# Patient Record
Sex: Male | Born: 1970 | Race: White | Hispanic: No | Marital: Married | Smoking: Never smoker
Health system: Southern US, Community
[De-identification: ages and names within clinical notes are randomized; demographics above are authoritative.]

## PROBLEM LIST (undated history)

## (undated) DIAGNOSIS — N2889 Other specified disorders of kidney and ureter: Secondary | ICD-10-CM

## (undated) DIAGNOSIS — Z87438 Personal history of other diseases of male genital organs: Secondary | ICD-10-CM

## (undated) DIAGNOSIS — K219 Gastro-esophageal reflux disease without esophagitis: Secondary | ICD-10-CM

## (undated) DIAGNOSIS — J302 Other seasonal allergic rhinitis: Secondary | ICD-10-CM

## (undated) DIAGNOSIS — I10 Essential (primary) hypertension: Secondary | ICD-10-CM

## (undated) DIAGNOSIS — E119 Type 2 diabetes mellitus without complications: Secondary | ICD-10-CM

## (undated) DIAGNOSIS — E785 Hyperlipidemia, unspecified: Secondary | ICD-10-CM

## (undated) DIAGNOSIS — Z87442 Personal history of urinary calculi: Secondary | ICD-10-CM

## (undated) HISTORY — PX: WISDOM TOOTH EXTRACTION: SHX21

## (undated) HISTORY — PX: TESTICLE SURGERY: SHX794

---
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
## 2018-06-19 DIAGNOSIS — N2889 Other specified disorders of kidney and ureter: Secondary | ICD-10-CM

## 2018-06-19 HISTORY — DX: Other specified disorders of kidney and ureter: N28.89

## 2018-07-09 ENCOUNTER — Ambulatory Visit (HOSPITAL_COMMUNITY)
Admission: RE | Admit: 2018-07-09 | Discharge: 2018-07-09 | Disposition: A | Discharge status: Home / Self Care | Payer: Commercial Managed Care - PPO | Source: Ambulatory Visit | Attending: Urology | Admitting: Urology

## 2018-07-09 ENCOUNTER — Other Ambulatory Visit (HOSPITAL_COMMUNITY): Payer: Self-pay | Admitting: Urology

## 2018-07-09 ENCOUNTER — Other Ambulatory Visit: Payer: Self-pay | Admitting: Urology

## 2018-07-09 ENCOUNTER — Other Ambulatory Visit: Payer: Self-pay

## 2018-07-09 DIAGNOSIS — D4102 Neoplasm of uncertain behavior of left kidney: Secondary | ICD-10-CM | POA: Diagnosis present

## 2018-07-09 MED ORDER — GADOBUTROL 1 MMOL/ML IV SOLN
10.0000 mL | Freq: Once | INTRAVENOUS | Status: AC | PRN
  Administered 2018-07-09: 10 mL via INTRAVENOUS

## 2018-07-16 ENCOUNTER — Encounter (HOSPITAL_COMMUNITY): Payer: Self-pay

## 2018-07-16 NOTE — Patient Instructions (Addendum)
DUE TO COVID-19 NO VISITORS ARE ALLOWED IN THE HOSPITAL AT THIS TIME   COVID SWAB TESTING MUST BE COMPLETED ON:  Monday, July 20, 2018 10:00AM (Must self quarantine after testing. Follow instructions on handout.)   Your procedure is scheduled on: Thursday, July 23, 2018   Surgery Time:  12Noon-4:26PM   Report to [facility] Main  Entrance    Report to admitting at 10:00 AM   Call this number if you have problems the morning of surgery [phone]   Do not eat food or drink liquids :After Midnight.   Brush your teeth the morning of surgery.   Do NOT smoke after Midnight   Take these medicines the morning of surgery with A SIP OF WATER: Atorvastatin  DO NOT TAKE ANY DIABETIC MEDICATIONS DAY OF YOUR SURGERY                               You may not have any metal on your body including jewelry, and body piercings             Do not wear lotions, powders, perfumes/cologne, or deodorant                           Men may shave face and neck.   Do not bring valuables to the hospital. [place].   Contacts, dentures or bridgework may not be worn into surgery.   Bring small overnight bag day of surgery.    Special Instructions: Bring a copy of your healthcare power of attorney and living will documents         the day of surgery if you haven't scanned them in before.              Please read over the following fact sheets you were given:  [facility] - Preparing for Surgery Before surgery, you can play an important role.  Because skin is not sterile, your skin needs to be as free of germs as possible.  You can reduce the number of germs on your skin by washing with CHG (chlorahexidine gluconate) soap before surgery.  CHG is an antiseptic cleaner which kills germs and bonds with the skin to continue killing germs even after washing. Please DO NOT use if you have an allergy to CHG or antibacterial soaps.  If your skin becomes  reddened/irritated stop using the CHG and inform your nurse when you arrive at Short Stay. Do not shave (including legs and underarms) for at least 48 hours prior to the first CHG shower.  You may shave your face/neck.  Please follow these instructions carefully:  1.  Shower with CHG Soap the night before surgery and the  morning of surgery.  2.  If you choose to wash your hair, wash your hair first as usual with your normal  shampoo.  3.  After you shampoo, rinse your hair and body thoroughly to remove the shampoo.                             4.  Use CHG as you would any other liquid soap.  You can apply chg directly to the skin and wash.  Gently with a scrungie or clean washcloth.  5.  Apply the CHG Soap to your body ONLY FROM THE NECK DOWN.   Do   not use on face/ open                           Wound or open sores. Avoid contact with eyes, ears mouth and   genitals (private parts).                       Wash face,  Genitals (private parts) with your normal soap.             6.  Wash thoroughly, paying special attention to the area where your    surgery  will be performed.  7.  Thoroughly rinse your body with warm water from the neck down.  8.  DO NOT shower/wash with your normal soap after using and rinsing off the CHG Soap.                9.  Pat yourself dry with a clean towel.            10.  Wear clean pajamas.            11.  Place clean sheets on your bed the night of your first shower and do not  sleep with pets. Day of Surgery : Do not apply any lotions/deodorants the morning of surgery.  Please wear clean clothes to the hospital/surgery center.  FAILURE TO FOLLOW THESE INSTRUCTIONS MAY RESULT IN THE CANCELLATION OF YOUR SURGERY  PATIENT SIGNATURE_________________________________  NURSE SIGNATURE__________________________________  ________________________________________________________________________

## 2018-07-16 NOTE — Progress Notes (Signed)
SPOKE W/  Legrand Como     SCREENING SYMPTOMS OF COVID 19:   COUGH--NO  RUNNY NOSE--- NO  SORE THROAT---NO  NASAL CONGESTION----NO  SNEEZING----NO  SHORTNESS OF BREATH---NO  DIFFICULTY BREATHING---NO  TEMP >100.0 -----NO  UNEXPLAINED BODY ACHES------NO  CHILLS -------- NO  HEADACHES ---------NO  LOSS OF SMELL/ TASTE --------NO    HAVE YOU OR ANY FAMILY MEMBER TRAVELLED PAST 14 DAYS OUT OF THE   COUNTY---LIVES IN [place] STATE----NO COUNTRY----NO  HAVE YOU OR ANY FAMILY MEMBER BEEN EXPOSED TO ANYONE WITH COVID 19? NO

## 2018-07-17 ENCOUNTER — Other Ambulatory Visit: Payer: Self-pay

## 2018-07-17 ENCOUNTER — Encounter (HOSPITAL_COMMUNITY)
Admission: RE | Admit: 2018-07-17 | Discharge: 2018-07-17 | Disposition: A | Discharge status: Home / Self Care | Payer: Commercial Managed Care - PPO | Source: Ambulatory Visit | Attending: Urology | Admitting: Urology

## 2018-07-17 ENCOUNTER — Encounter (HOSPITAL_COMMUNITY): Payer: Self-pay

## 2018-07-17 DIAGNOSIS — Z01818 Encounter for other preprocedural examination: Secondary | ICD-10-CM | POA: Insufficient documentation

## 2018-07-17 DIAGNOSIS — I498 Other specified cardiac arrhythmias: Secondary | ICD-10-CM | POA: Diagnosis not present

## 2018-07-17 DIAGNOSIS — Z1159 Encounter for screening for other viral diseases: Secondary | ICD-10-CM | POA: Insufficient documentation

## 2018-07-17 DIAGNOSIS — N2889 Other specified disorders of kidney and ureter: Secondary | ICD-10-CM | POA: Diagnosis not present

## 2018-07-17 HISTORY — DX: Type 2 diabetes mellitus without complications: E11.9

## 2018-07-17 HISTORY — DX: Personal history of other diseases of male genital organs: Z87.438

## 2018-07-17 HISTORY — DX: Hyperlipidemia, unspecified: E78.5

## 2018-07-17 HISTORY — DX: Gastro-esophageal reflux disease without esophagitis: K21.9

## 2018-07-17 HISTORY — DX: Personal history of urinary calculi: Z87.442

## 2018-07-17 HISTORY — DX: Other seasonal allergic rhinitis: J30.2

## 2018-07-17 HISTORY — DX: Essential (primary) hypertension: I10

## 2018-07-17 LAB — ABO/RH: ABO/RH(D): O POS

## 2018-07-17 LAB — COMPREHENSIVE METABOLIC PANEL
ALT: 16 U/L (ref 0–44)
AST: 16 U/L (ref 15–41)
Albumin: 4.7 g/dL (ref 3.5–5.0)
Alkaline Phosphatase: 80 U/L (ref 38–126)
Anion gap: 9 (ref 5–15)
BUN: 21 mg/dL — ABNORMAL HIGH (ref 6–20)
CO2: 28 mmol/L (ref 22–32)
Calcium: 9.8 mg/dL (ref 8.9–10.3)
Chloride: 102 mmol/L (ref 98–111)
Creatinine, Ser: 1.34 mg/dL — ABNORMAL HIGH (ref 0.61–1.24)
GFR calc Af Amer: >60 mL/min (ref >60)
GFR calc non Af Amer: >60 mL/min (ref >60)
Glucose, Bld: 145 mg/dL — ABNORMAL HIGH (ref 70–99)
Potassium: 4.3 mmol/L (ref 3.5–5.1)
Sodium: 139 mmol/L (ref 135–145)
Total Bilirubin: 0.8 mg/dL (ref 0.3–1.2)
Total Protein: 7.8 g/dL (ref 6.5–8.1)

## 2018-07-17 LAB — CBC
HCT: 39.2 % (ref 39.0–52.0)
Hemoglobin: 12.8 g/dL — ABNORMAL LOW (ref 13.0–17.0)
MCH: 29.6 pg (ref 26.0–34.0)
MCHC: 32.7 g/dL (ref 30.0–36.0)
MCV: 90.7 fL (ref 80.0–100.0)
Platelets: 219 10*3/uL (ref 150–400)
RBC: 4.32 MIL/uL (ref 4.22–5.81)
RDW: 12.5 % (ref 11.5–15.5)
WBC: 5.3 10*3/uL (ref 4.0–10.5)
nRBC: 0 % (ref 0.0–0.2)

## 2018-07-17 LAB — HEMOGLOBIN A1C
Hgb A1c MFr Bld: 6.6 % — ABNORMAL HIGH (ref 4.8–5.6)
Mean Plasma Glucose: 142.72 mg/dL

## 2018-07-17 LAB — GLUCOSE, CAPILLARY: Glucose-Capillary: 88 mg/dL (ref 70–99)

## 2018-07-18 LAB — URINE CULTURE: Culture: NO GROWTH

## 2018-07-20 ENCOUNTER — Other Ambulatory Visit: Payer: Self-pay

## 2018-07-20 ENCOUNTER — Other Ambulatory Visit (HOSPITAL_COMMUNITY)
Admission: RE | Admit: 2018-07-20 | Discharge: 2018-07-20 | Disposition: A | Discharge status: Home / Self Care | Payer: Commercial Managed Care - PPO | Source: Ambulatory Visit | Attending: Urology | Admitting: Urology

## 2018-07-20 DIAGNOSIS — Z01818 Encounter for other preprocedural examination: Secondary | ICD-10-CM | POA: Diagnosis not present

## 2018-07-22 NOTE — Progress Notes (Addendum)
Message left for patient to call back with any symptoms of covid 19 or any new travel history.    SPOKE W/  Patient via phone     SCREENING SYMPTOMS OF COVID 19:   COUGH--no  RUNNY NOSE--- no  SORE THROAT---no  NASAL CONGESTION----no  SNEEZING----no  SHORTNESS OF BREATH---no  DIFFICULTY BREATHING---no  TEMP >100.0 -----no  UNEXPLAINED BODY ACHES------no  CHILLS -------- no  HEADACHES ---------no  LOSS OF SMELL/ TASTE --------no    HAVE YOU OR ANY FAMILY MEMBER TRAVELLED PAST 14 DAYS OUT OF THE   COUNTY---no STATE----no COUNTRY----no  HAVE YOU OR ANY FAMILY MEMBER BEEN EXPOSED TO ANYONE WITH COVID 19? no

## 2018-07-23 ENCOUNTER — Inpatient Hospital Stay (HOSPITAL_COMMUNITY)
Admission: RE | Admit: 2018-07-23 | Discharge: 2018-07-24 | DRG: 658 | Disposition: A | Discharge status: Home / Self Care | Payer: Commercial Managed Care - PPO | Attending: Urology | Admitting: Urology

## 2018-07-23 ENCOUNTER — Encounter (HOSPITAL_COMMUNITY): Payer: Self-pay | Admitting: *Deleted

## 2018-07-23 ENCOUNTER — Inpatient Hospital Stay (HOSPITAL_COMMUNITY): Payer: Commercial Managed Care - PPO | Admitting: Physician Assistant

## 2018-07-23 ENCOUNTER — Encounter (HOSPITAL_COMMUNITY)
Admission: RE | Disposition: A | Discharge status: Home / Self Care | Payer: Self-pay | Source: Home / Self Care | Attending: Urology

## 2018-07-23 ENCOUNTER — Other Ambulatory Visit: Payer: Self-pay

## 2018-07-23 ENCOUNTER — Inpatient Hospital Stay (HOSPITAL_COMMUNITY): Payer: Commercial Managed Care - PPO | Admitting: Certified Registered"

## 2018-07-23 DIAGNOSIS — N2889 Other specified disorders of kidney and ureter: Secondary | ICD-10-CM | POA: Diagnosis present

## 2018-07-23 DIAGNOSIS — K219 Gastro-esophageal reflux disease without esophagitis: Secondary | ICD-10-CM | POA: Diagnosis present

## 2018-07-23 DIAGNOSIS — I10 Essential (primary) hypertension: Secondary | ICD-10-CM | POA: Diagnosis present

## 2018-07-23 DIAGNOSIS — Z7984 Long term (current) use of oral hypoglycemic drugs: Secondary | ICD-10-CM

## 2018-07-23 DIAGNOSIS — E119 Type 2 diabetes mellitus without complications: Secondary | ICD-10-CM | POA: Diagnosis present

## 2018-07-23 DIAGNOSIS — C642 Malignant neoplasm of left kidney, except renal pelvis: Secondary | ICD-10-CM | POA: Diagnosis present

## 2018-07-23 DIAGNOSIS — E7800 Pure hypercholesterolemia, unspecified: Secondary | ICD-10-CM | POA: Diagnosis present

## 2018-07-23 HISTORY — PX: ROBOT ASSITED LAPAROSCOPIC NEPHROURETERECTOMY: SHX6077

## 2018-07-23 LAB — TYPE AND SCREEN
ABO/RH(D): O POS
Antibody Screen: NEGATIVE

## 2018-07-23 LAB — GLUCOSE, CAPILLARY
Glucose-Capillary: 105 mg/dL — ABNORMAL HIGH (ref 70–99)
Glucose-Capillary: 166 mg/dL — ABNORMAL HIGH (ref 70–99)
Glucose-Capillary: 147 mg/dL — ABNORMAL HIGH (ref 70–99)

## 2018-07-23 LAB — HEMOGLOBIN AND HEMATOCRIT, BLOOD
HCT: 36.8 % — ABNORMAL LOW (ref 39.0–52.0)
Hemoglobin: 12.5 g/dL — ABNORMAL LOW (ref 13.0–17.0)

## 2018-07-23 LAB — NOVEL CORONAVIRUS, NAA (HOSP ORDER, SEND-OUT TO REF LAB; TAT 18-24 HRS): SARS-CoV-2, NAA: NOT DETECTED

## 2018-07-23 SURGERY — NEPHROURETERECTOMY, ROBOT-ASSISTED, LAPAROSCOPIC
Anesthesia: General | Laterality: Left

## 2018-07-23 MED ORDER — SULFAMETHOXAZOLE-TRIMETHOPRIM 800-160 MG PO TABS: 1.0000 | ORAL_TABLET | Freq: Two times a day (BID) | ORAL | 0 refills | Status: DC

## 2018-07-23 MED ORDER — PHENYLEPHRINE 40 MCG/ML (10ML) SYRINGE FOR IV PUSH (FOR BLOOD PRESSURE SUPPORT)
PREFILLED_SYRINGE | INTRAVENOUS | Status: DC | PRN
  Administered 2018-07-23 (×2): 80 ug via INTRAVENOUS

## 2018-07-23 MED ORDER — BUPIVACAINE-EPINEPHRINE 0.5% -1:200000 IJ SOLN
INTRAMUSCULAR | Status: DC | PRN
  Administered 2018-07-23: 30 mL

## 2018-07-23 MED ORDER — ONDANSETRON HCL 4 MG/2ML IJ SOLN
INTRAMUSCULAR | Status: AC
  Filled 2018-07-23: qty 2

## 2018-07-23 MED ORDER — LACTATED RINGERS IV SOLN
INTRAVENOUS | Status: DC
  Administered 2018-07-23: 11:00:00 via INTRAVENOUS

## 2018-07-23 MED ORDER — PHENYLEPHRINE 40 MCG/ML (10ML) SYRINGE FOR IV PUSH (FOR BLOOD PRESSURE SUPPORT)
PREFILLED_SYRINGE | INTRAVENOUS | Status: AC
  Filled 2018-07-23: qty 10

## 2018-07-23 MED ORDER — TRAMADOL HCL 50 MG PO TABS: 50.0000 mg | ORAL_TABLET | Freq: Four times a day (QID) | ORAL | 0 refills | Status: DC | PRN

## 2018-07-23 MED ORDER — OXYCODONE HCL 5 MG/5ML PO SOLN: 5.0000 mg | Freq: Once | ORAL | Status: DC | PRN

## 2018-07-23 MED ORDER — ONDANSETRON HCL 4 MG/2ML IJ SOLN
INTRAMUSCULAR | Status: DC | PRN
  Administered 2018-07-23: 4 mg via INTRAVENOUS

## 2018-07-23 MED ORDER — HYDROMORPHONE HCL 1 MG/ML IJ SOLN: 0.2500 mg | INTRAMUSCULAR | Status: DC | PRN

## 2018-07-23 MED ORDER — FENTANYL CITRATE (PF) 250 MCG/5ML IJ SOLN
INTRAMUSCULAR | Status: AC
  Filled 2018-07-23: qty 5

## 2018-07-23 MED ORDER — ACETAMINOPHEN 10 MG/ML IV SOLN
1000.0000 mg | Freq: Four times a day (QID) | INTRAVENOUS | Status: AC
  Administered 2018-07-23 – 2018-07-24 (×4): 1000 mg via INTRAVENOUS
  Filled 2018-07-23 (×4): qty 100

## 2018-07-23 MED ORDER — LACTATED RINGERS IR SOLN
Status: DC | PRN
  Administered 2018-07-23: 1000 mL

## 2018-07-23 MED ORDER — ACETAMINOPHEN 500 MG PO TABS
1000.0000 mg | ORAL_TABLET | Freq: Once | ORAL | Status: AC
  Administered 2018-07-23: 1000 mg via ORAL
  Filled 2018-07-23: qty 2

## 2018-07-23 MED ORDER — PROMETHAZINE HCL 25 MG/ML IJ SOLN: 6.2500 mg | INTRAMUSCULAR | Status: DC | PRN

## 2018-07-23 MED ORDER — MIDAZOLAM HCL 2 MG/2ML IJ SOLN
INTRAMUSCULAR | Status: AC
  Filled 2018-07-23: qty 2

## 2018-07-23 MED ORDER — ROCURONIUM BROMIDE 10 MG/ML (PF) SYRINGE
PREFILLED_SYRINGE | INTRAVENOUS | Status: AC
  Filled 2018-07-23: qty 10

## 2018-07-23 MED ORDER — STERILE WATER FOR IRRIGATION IR SOLN
Status: DC | PRN
  Administered 2018-07-23: 1000 mL

## 2018-07-23 MED ORDER — DIPHENHYDRAMINE HCL 12.5 MG/5ML PO ELIX: 12.5000 mg | ORAL_SOLUTION | Freq: Four times a day (QID) | ORAL | Status: DC | PRN

## 2018-07-23 MED ORDER — PROPOFOL 10 MG/ML IV BOLUS
INTRAVENOUS | Status: DC | PRN
  Administered 2018-07-23: 200 mg via INTRAVENOUS
  Administered 2018-07-23: 80 mg via INTRAVENOUS

## 2018-07-23 MED ORDER — SODIUM CHLORIDE 0.45 % IV SOLN
INTRAVENOUS | Status: DC
  Administered 2018-07-23 – 2018-07-24 (×2): via INTRAVENOUS

## 2018-07-23 MED ORDER — TRAMADOL HCL 50 MG PO TABS
50.0000 mg | ORAL_TABLET | Freq: Four times a day (QID) | ORAL | Status: DC | PRN
  Administered 2018-07-24: 50 mg via ORAL
  Filled 2018-07-23: qty 1

## 2018-07-23 MED ORDER — DEXAMETHASONE SODIUM PHOSPHATE 10 MG/ML IJ SOLN
INTRAMUSCULAR | Status: AC
  Filled 2018-07-23: qty 1

## 2018-07-23 MED ORDER — LACTATED RINGERS IV SOLN
INTRAVENOUS | Status: DC
  Administered 2018-07-23 (×3): via INTRAVENOUS

## 2018-07-23 MED ORDER — SODIUM CHLORIDE (PF) 0.9 % IJ SOLN
INTRAMUSCULAR | Status: AC
  Filled 2018-07-23: qty 50

## 2018-07-23 MED ORDER — EPHEDRINE 5 MG/ML INJ
INTRAVENOUS | Status: AC
  Filled 2018-07-23: qty 10

## 2018-07-23 MED ORDER — LIDOCAINE 2% (20 MG/ML) 5 ML SYRINGE
INTRAMUSCULAR | Status: DC | PRN
  Administered 2018-07-23: 60 mg via INTRAVENOUS

## 2018-07-23 MED ORDER — CEFAZOLIN SODIUM-DEXTROSE 2-3 GM-%(50ML) IV SOLR
INTRAVENOUS | Status: DC | PRN
  Administered 2018-07-23: 2 g via INTRAVENOUS

## 2018-07-23 MED ORDER — FENTANYL CITRATE (PF) 100 MCG/2ML IJ SOLN
INTRAMUSCULAR | Status: DC | PRN
  Administered 2018-07-23 (×7): 50 ug via INTRAVENOUS

## 2018-07-23 MED ORDER — FENTANYL CITRATE (PF) 100 MCG/2ML IJ SOLN
INTRAMUSCULAR | Status: AC
  Filled 2018-07-23: qty 2

## 2018-07-23 MED ORDER — HYDROMORPHONE HCL 1 MG/ML IJ SOLN
0.5000 mg | INTRAMUSCULAR | Status: DC | PRN
  Administered 2018-07-24: 1 mg via INTRAVENOUS
  Filled 2018-07-23: qty 1

## 2018-07-23 MED ORDER — ROCURONIUM BROMIDE 10 MG/ML (PF) SYRINGE
PREFILLED_SYRINGE | INTRAVENOUS | Status: DC | PRN
  Administered 2018-07-23 (×2): 20 mg via INTRAVENOUS
  Administered 2018-07-23: 60 mg via INTRAVENOUS

## 2018-07-23 MED ORDER — DOCUSATE SODIUM 100 MG PO CAPS
100.0000 mg | ORAL_CAPSULE | Freq: Two times a day (BID) | ORAL | Status: DC
  Administered 2018-07-23 – 2018-07-24 (×2): 100 mg via ORAL
  Filled 2018-07-23 (×2): qty 1

## 2018-07-23 MED ORDER — ATORVASTATIN CALCIUM 20 MG PO TABS
20.0000 mg | ORAL_TABLET | Freq: Every day | ORAL | Status: DC
  Administered 2018-07-24: 20 mg via ORAL
  Filled 2018-07-23: qty 1

## 2018-07-23 MED ORDER — SUGAMMADEX SODIUM 200 MG/2ML IV SOLN
INTRAVENOUS | Status: AC
  Filled 2018-07-23: qty 2

## 2018-07-23 MED ORDER — CEFAZOLIN SODIUM-DEXTROSE 2-4 GM/100ML-% IV SOLN
2.0000 g | INTRAVENOUS | Status: AC
  Administered 2018-07-23: 2 g via INTRAVENOUS
  Filled 2018-07-23: qty 100

## 2018-07-23 MED ORDER — KETOROLAC TROMETHAMINE 15 MG/ML IJ SOLN
15.0000 mg | Freq: Four times a day (QID) | INTRAMUSCULAR | Status: DC
  Administered 2018-07-23 – 2018-07-24 (×4): 15 mg via INTRAVENOUS
  Filled 2018-07-23 (×4): qty 1

## 2018-07-23 MED ORDER — ONDANSETRON HCL 4 MG/2ML IJ SOLN: 4.0000 mg | INTRAMUSCULAR | Status: DC | PRN

## 2018-07-23 MED ORDER — BACITRACIN-NEOMYCIN-POLYMYXIN 400-5-5000 EX OINT: 1.0000 "application " | TOPICAL_OINTMENT | Freq: Three times a day (TID) | CUTANEOUS | Status: DC | PRN

## 2018-07-23 MED ORDER — SUCCINYLCHOLINE CHLORIDE 200 MG/10ML IV SOSY
PREFILLED_SYRINGE | INTRAVENOUS | Status: AC
  Filled 2018-07-23: qty 10

## 2018-07-23 MED ORDER — HYDROMORPHONE HCL 2 MG/ML IJ SOLN
INTRAMUSCULAR | Status: AC
  Filled 2018-07-23: qty 1

## 2018-07-23 MED ORDER — SUGAMMADEX SODIUM 500 MG/5ML IV SOLN
INTRAVENOUS | Status: AC
  Filled 2018-07-23: qty 5

## 2018-07-23 MED ORDER — LIDOCAINE 2% (20 MG/ML) 5 ML SYRINGE
INTRAMUSCULAR | Status: AC
  Filled 2018-07-23: qty 5

## 2018-07-23 MED ORDER — DEXAMETHASONE SODIUM PHOSPHATE 10 MG/ML IJ SOLN
INTRAMUSCULAR | Status: DC | PRN
  Administered 2018-07-23: 10 mg via INTRAVENOUS

## 2018-07-23 MED ORDER — PROPOFOL 10 MG/ML IV BOLUS
INTRAVENOUS | Status: AC
  Filled 2018-07-23: qty 20

## 2018-07-23 MED ORDER — DIPHENHYDRAMINE HCL 50 MG/ML IJ SOLN: 12.5000 mg | Freq: Four times a day (QID) | INTRAMUSCULAR | Status: DC | PRN

## 2018-07-23 MED ORDER — BELLADONNA ALKALOIDS-OPIUM 16.2-60 MG RE SUPP: 1.0000 | Freq: Four times a day (QID) | RECTAL | Status: DC | PRN

## 2018-07-23 MED ORDER — CEFAZOLIN SODIUM-DEXTROSE 2-4 GM/100ML-% IV SOLN
INTRAVENOUS | Status: AC
  Filled 2018-07-23: qty 100

## 2018-07-23 MED ORDER — HYDROMORPHONE HCL 1 MG/ML IJ SOLN
INTRAMUSCULAR | Status: DC | PRN
  Administered 2018-07-23 (×4): 0.5 mg via INTRAVENOUS

## 2018-07-23 MED ORDER — BUPIVACAINE-EPINEPHRINE 0.5% -1:200000 IJ SOLN
INTRAMUSCULAR | Status: AC
  Filled 2018-07-23: qty 1

## 2018-07-23 MED ORDER — SUGAMMADEX SODIUM 200 MG/2ML IV SOLN
INTRAVENOUS | Status: DC | PRN
  Administered 2018-07-23: 300 mg via INTRAVENOUS

## 2018-07-23 MED ORDER — BUPIVACAINE LIPOSOME 1.3 % IJ SUSP
20.0000 mL | Freq: Once | INTRAMUSCULAR | Status: AC
  Administered 2018-07-23: 20 mL
  Filled 2018-07-23: qty 20

## 2018-07-23 MED ORDER — MIDAZOLAM HCL 5 MG/5ML IJ SOLN
INTRAMUSCULAR | Status: DC | PRN
  Administered 2018-07-23: 2 mg via INTRAVENOUS

## 2018-07-23 MED ORDER — OXYCODONE HCL 5 MG PO TABS: 5.0000 mg | ORAL_TABLET | Freq: Once | ORAL | Status: DC | PRN

## 2018-07-23 MED ORDER — INSULIN ASPART 100 UNIT/ML ~~LOC~~ SOLN
0.0000 [IU] | Freq: Three times a day (TID) | SUBCUTANEOUS | Status: DC
  Administered 2018-07-24: 3 [IU] via SUBCUTANEOUS
  Administered 2018-07-24: 2 [IU] via SUBCUTANEOUS

## 2018-07-23 SURGICAL SUPPLY — 68 items
BAG LAPAROSCOPIC 12 15 PORT 16 (BASKET) IMPLANT
BAG RETRIEVAL 12/15 (BASKET)
BAG RETRIEVAL 12/15MM (BASKET)
CATH FOLEY 3WAY  5CC 16FR (CATHETERS)
CATH FOLEY 3WAY  5CC 18FR (CATHETERS) ×2
CATH FOLEY 3WAY 5CC 16FR (CATHETERS) IMPLANT
CATH FOLEY 3WAY 5CC 18FR (CATHETERS) ×1 IMPLANT
CHLORAPREP W/TINT 26 (MISCELLANEOUS) ×3 IMPLANT
CLIP VESOLOCK LG 6/CT PURPLE (CLIP) ×9 IMPLANT
CLIP VESOLOCK MED LG 6/CT (CLIP) ×6 IMPLANT
CLIP VESOLOCK XL 6/CT (CLIP) IMPLANT
COVER SURGICAL LIGHT HANDLE (MISCELLANEOUS) ×3 IMPLANT
COVER TIP SHEARS 8 DVNC (MISCELLANEOUS) ×1 IMPLANT
COVER TIP SHEARS 8MM DA VINCI (MISCELLANEOUS) ×2
COVER WAND RF STERILE (DRAPES) IMPLANT
CUTTER ECHEON FLEX ENDO 45 340 (ENDOMECHANICALS) ×3 IMPLANT
DECANTER SPIKE VIAL GLASS SM (MISCELLANEOUS) ×3 IMPLANT
DERMABOND ADVANCED (GAUZE/BANDAGES/DRESSINGS) ×2
DERMABOND ADVANCED .7 DNX12 (GAUZE/BANDAGES/DRESSINGS) ×1 IMPLANT
DRAIN CHANNEL 15F RND FF 3/16 (WOUND CARE) ×3 IMPLANT
DRAPE ARM DVNC X/XI (DISPOSABLE) ×4 IMPLANT
DRAPE COLUMN DVNC XI (DISPOSABLE) ×1 IMPLANT
DRAPE DA VINCI XI ARM (DISPOSABLE) ×8
DRAPE DA VINCI XI COLUMN (DISPOSABLE) ×2
DRAPE INCISE IOBAN 66X45 STRL (DRAPES) ×3 IMPLANT
DRAPE SHEET LG 3/4 BI-LAMINATE (DRAPES) ×3 IMPLANT
ELECT PENCIL ROCKER SW 15FT (MISCELLANEOUS) ×3 IMPLANT
ELECT REM PT RETURN 15FT ADLT (MISCELLANEOUS) ×3 IMPLANT
EVACUATOR SILICONE 100CC (DRAIN) ×3 IMPLANT
GAUZE SPONGE 2X2 8PLY STRL LF (GAUZE/BANDAGES/DRESSINGS) IMPLANT
GLOVE BIO SURGEON STRL SZ 6.5 (GLOVE) ×2 IMPLANT
GLOVE BIO SURGEONS STRL SZ 6.5 (GLOVE) ×1
GLOVE BIOGEL M STRL SZ7.5 (GLOVE) ×6 IMPLANT
GOWN STRL REUS W/TWL LRG LVL3 (GOWN DISPOSABLE) ×9 IMPLANT
HEMOSTAT SURGICEL 4X8 (HEMOSTASIS) ×3 IMPLANT
IRRIG SUCT STRYKERFLOW 2 WTIP (MISCELLANEOUS) ×3
IRRIGATION SUCT STRKRFLW 2 WTP (MISCELLANEOUS) ×1 IMPLANT
KIT BASIN OR (CUSTOM PROCEDURE TRAY) ×3 IMPLANT
KIT TURNOVER KIT A (KITS) ×3 IMPLANT
LOOP VESSEL MAXI BLUE (MISCELLANEOUS) ×3 IMPLANT
PLUG CATH AND CAP STER (CATHETERS) ×3 IMPLANT
PORT ACCESS TROCAR AIRSEAL 12 (TROCAR) ×1 IMPLANT
PORT ACCESS TROCAR AIRSEAL 5M (TROCAR) ×2
PROTECTOR NERVE ULNAR (MISCELLANEOUS) ×6 IMPLANT
SEAL CANN UNIV 5-8 DVNC XI (MISCELLANEOUS) ×4 IMPLANT
SEAL XI 5MM-8MM UNIVERSAL (MISCELLANEOUS) ×8
SET TRI-LUMEN FLTR TB AIRSEAL (TUBING) ×3 IMPLANT
SOLUTION ELECTROLUBE (MISCELLANEOUS) ×3 IMPLANT
SPONGE GAUZE 2X2 STER 10/PKG (GAUZE/BANDAGES/DRESSINGS)
STAPLE RELOAD 45 WHT (STAPLE) ×2 IMPLANT
STAPLE RELOAD 45MM WHITE (STAPLE) ×4
SUT ETHILON 3 0 PS 1 (SUTURE) ×3 IMPLANT
SUT MNCRL AB 4-0 PS2 18 (SUTURE) ×6 IMPLANT
SUT PDS AB 1 CTX 36 (SUTURE) ×6 IMPLANT
SUT V-LOC BARB 180 2/0GR6 GS22 (SUTURE) ×6
SUT VIC AB 0 CT1 27 (SUTURE) ×2
SUT VIC AB 0 CT1 27XBRD ANTBC (SUTURE) ×1 IMPLANT
SUT VIC AB 0 UR5 27 (SUTURE) ×3 IMPLANT
SUT VIC AB 3-0 SH 27 (SUTURE) ×4
SUT VIC AB 3-0 SH 27XBRD (SUTURE) ×2 IMPLANT
SUT VICRYL 0 UR6 27IN ABS (SUTURE) ×6 IMPLANT
SUTURE V-LC BRB 180 2/0GR6GS22 (SUTURE) ×2 IMPLANT
TOWEL OR 17X26 10 PK STRL BLUE (TOWEL DISPOSABLE) ×3 IMPLANT
TOWEL OR NON WOVEN STRL DISP B (DISPOSABLE) ×3 IMPLANT
TRAY FOLEY MTR SLVR 16FR STAT (SET/KITS/TRAYS/PACK) ×3 IMPLANT
TRAY LAPAROSCOPIC (CUSTOM PROCEDURE TRAY) ×3 IMPLANT
TROCAR BLADELESS OPT 5 100 (ENDOMECHANICALS) IMPLANT
TROCAR XCEL 12X100 BLDLESS (ENDOMECHANICALS) ×3 IMPLANT

## 2018-07-23 NOTE — Interval H&P Note (Signed)
History and Physical Interval Note:  07/23/2018 12:28 PM  Jorge Welch  has presented today for surgery, with the diagnosis of LEFT RENAL MASS.  The various methods of treatment have been discussed with the patient and family. After consideration of risks, benefits and other options for treatment, the patient has consented to  Procedure(s): XI ROBOT ASSITED LAPAROSCOPIC NEPHROURETERECTOMY (Left) as a surgical intervention.  The patient's history has been reviewed, patient examined, no change in status, stable for surgery.  I have reviewed the patient's chart and labs.  Questions were answered to the patient's satisfaction.     Ardis Hughs

## 2018-07-23 NOTE — Transfer of Care (Signed)
Immediate Anesthesia Transfer of Care Note  Patient: Jorge Welch  Procedure(s) Performed: XI ROBOT ASSITED LAPAROSCOPIC NEPHROURETERECTOMY (Left )  Patient Location: PACU  Anesthesia Type:General  Level of Consciousness: awake, alert  and patient cooperative  Airway & Oxygen Therapy: Patient Spontanous Breathing and Patient connected to face mask oxygen  Post-op Assessment: Report given to RN and Post -op Vital signs reviewed and stable  Post vital signs: Reviewed and stable  Last Vitals:  Vitals Value Taken Time  BP 161/112 07/23/2018  5:11 PM  Temp    Pulse 97 07/23/2018  5:14 PM  Resp 13 07/23/2018  5:14 PM  SpO2 100 % 07/23/2018  5:14 PM  Vitals shown include unvalidated device data.  Last Pain:  Vitals:   07/23/18 1047  TempSrc:   PainSc: 0-No pain         Complications: No apparent anesthesia complications

## 2018-07-23 NOTE — Anesthesia Postprocedure Evaluation (Signed)
Anesthesia Post Note  Patient: Jorge Welch  Procedure(s) Performed: XI ROBOT ASSITED LAPAROSCOPIC NEPHROURETERECTOMY (Left )     Patient location during evaluation: PACU Anesthesia Type: General Level of consciousness: awake and alert Pain management: pain level controlled Vital Signs Assessment: post-procedure vital signs reviewed and stable Respiratory status: spontaneous breathing, nonlabored ventilation, respiratory function stable and patient connected to nasal cannula oxygen Cardiovascular status: blood pressure returned to baseline and stable Postop Assessment: no apparent nausea or vomiting Anesthetic complications: no    Last Vitals:  Vitals:   07/23/18 1845 07/23/18 1900  BP: (!) 165/101 (!) 161/99  Pulse: 92 91  Resp: 20 18  Temp:  36.8 C  SpO2: 95% 97%    Last Pain:  Vitals:   07/23/18 1845  TempSrc:   PainSc: 0-No pain                 [name] P Camilia [place]

## 2018-07-23 NOTE — Anesthesia Procedure Notes (Signed)
Procedure Name: Intubation Date/Time: 07/23/2018 1:12 PM Performed by: Silas [place], CRNA Pre-anesthesia Checklist: Patient identified, Emergency Drugs available, Suction available and Patient being monitored Patient Re-evaluated:Patient Re-evaluated prior to induction Oxygen Delivery Method: Circle system utilized Preoxygenation: Pre-oxygenation with 100% oxygen Induction Type: IV induction and Rapid sequence Laryngoscope Size: 4 Grade View: Grade I Tube type: Oral Tube size: 7.5 mm Number of attempts: 1 Airway Equipment and Method: Stylet and Oral airway Placement Confirmation: ETT inserted through vocal cords under direct vision,  positive ETCO2 and breath sounds checked- equal and bilateral Secured at: 24 cm Tube secured with: Tape Dental Injury: Teeth and Oropharynx as per pre-operative assessment

## 2018-07-23 NOTE — Op Note (Signed)
Preoperative diagnosis:  1. 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 Same    Procedure: 1. left Robotic-assisted laparoscopic nephroureterectomy   Surgeon: Ardis Hughs, MD First Asst.: Debbrah Alar, PA-C  Anesthesia: General   Complications: None   Intraoperative findings:  Kidney and ureter were taken en bloc with a bladder cuff included in the specimen.  Three arteries were clipped, the renal vein was stapled.   EBL: 125 mL's   Specimens: left kidney and ureter with bladder cuff   Indication: Jorge Welch is a 48 y.o.  and has a history of left upper tract transitional cell carcinoma.  After reviewing the management options for treatment, he elected to proceed with the above surgical procedure(s). We have discussed the potential benefits and risks of the procedure, side effects of the proposed treatment, the likelihood of the patient achieving the goals of the procedure, and any potential problems that might occur during the procedure or recuperation. Informed consent has been obtained.   Description of procedure:   The patient was taken to the operating room and general anesthesia was induced.  They were then placed in the left lateral decubitus position with the bed flexed slightly. A Foley catheter was inserted. The patient was secured to the table with a beanbag as well as 4 x 4 tape and all bony prominences were taped. A timeout was then performed.   We then made an 8 mm incision superior and lateral to the left umbilicus and cut down to the fascia where I was able to pierce the peritoneum and gently pass an 8 mm robotic trocar into the abdomen. The abdomen was then insufflated and the robotic camera was then passed through the trocar.  We then placed a second robotic port 8 cm superior and more lateral just at the subcostal margin in the left upper quadrant. A third robotic arm was then placed below the camera  port and more medial 8 cm from the camera. A fourth arm was then placed further inferior and nearly midline making a straight line angled to the patient's left shoulder. A 12 mm port was then placed in the supraumbilical midline position. The robot was then docked.   The dissection began by removing the omental adhesions off the anterior abdominal wall. We then proceeded to mobilize the descending and transverse colon off the patient's kidney medially. This was carried out down to the proximal ureter. Once once the colon was then off of the kidney we entered into the retroperitoneal space the lower pole and isolated the ureter.  I then placed a fourth robotic arm underneath the ureter lifting the kidney up and medially. We then continued our dissection down to the hilum. The smaller lower pole artery was clipped with weck clips.  We then made our way superiorly and isolated and clipped two additional arteries.  We then dissected out the vein and and took the vein with a laparoscopic stapler.  We then freed the kidney from the medial and upper pole attachments.  We then freed the posterior and lateral attachments of the kidney using a combination of blunt and sharp dissection. Working our way to the lower pole the kidney was ultimately freed and are attention was focused then on the ureter. The ureter was taken down to the level of the iliac vessels were crossed down into the pelvis.    The distal ureter was then dissected free from the surrounding tissues ensuring preservation of  the superior vesical artery. Once at the ureteral hiatus/UVJ a 3-0 V-lock suture was placed laterally and used to retract the bladder laterally as the distal aspect of the ureter and ureteral orifice was dissected out. The bladder was then filled with 300 mL of fluid and ultimately the bladder was entered at the left UVJ. The bladder cuff was then excised. Using the preplaced 3-0 V lock suture the bladder was then closed in a running  fashion. A second layer closure was performed using a 2-0 V-loc. The bladder was then filled up again ensuring that there was no leak. The specimen was then placed in a large specimen bag.  A drain was then placed through the right lower quadrant position and the robotic port removed.   The robot was then undocked and the ports removed. A supraumbilical midline incision was then made down to the fascia and the specimen was extracted through this area. The rectus fascia was then reapproximated with a 1-0 PDS suture. 3-0 Vicryl was used to close the deep dermal layers. Ex parole was then injected into all the incisions. The incisions were then subsequently reapproximated with 4-0 Monocryl in a subcuticular fashion. A drain stitch was placed in the drain port and secured to the drain. Dermabond was then applied to the incisions.   The patient was subsequently extubated and returned to the PACU is stable condition.

## 2018-07-23 NOTE — Anesthesia Preprocedure Evaluation (Addendum)
Anesthesia Evaluation  Patient identified by MRN, date of birth, ID band Patient awake    Reviewed: Allergy & Precautions, NPO status , Patient's Chart, lab work & pertinent test results  Airway Mallampati: III  TM Distance: >3 FB Neck ROM: Full    Dental  (+) Loose,    Pulmonary neg pulmonary ROS,    Pulmonary exam normal breath sounds clear to auscultation       Cardiovascular hypertension, Pt. on medications Normal cardiovascular exam Rhythm:Regular Rate:Normal     Neuro/Psych negative neurological ROS  negative psych ROS   GI/Hepatic negative GI ROS, Neg liver ROS,   Endo/Other  diabetes, Oral Hypoglycemic Agents  Renal/GU negative Renal ROS     Musculoskeletal negative musculoskeletal ROS (+)   Abdominal   Peds  Hematology HLD   Anesthesia Other Findings LEFT RENAL MASS  Reproductive/Obstetrics                            Anesthesia Physical Anesthesia Plan  ASA: II  Anesthesia Plan: General   Post-op Pain Management:    Induction: Intravenous  PONV Risk Score and Plan: 3 and Midazolam, Dexamethasone, Ondansetron and Treatment may vary due to age or medical condition  Airway Management Planned: Oral ETT  Additional Equipment:   Intra-op Plan:   Post-operative Plan: Extubation in OR  Informed Consent: I have reviewed the patients History and Physical, chart, labs and discussed the procedure including the risks, benefits and alternatives for the proposed anesthesia with the patient or authorized representative who has indicated his/her understanding and acceptance.     Dental advisory given  Plan Discussed with: CRNA  Anesthesia Plan Comments:         Anesthesia Quick Evaluation

## 2018-07-23 NOTE — Interval H&P Note (Signed)
History and Physical Interval Note:  07/23/2018 12:29 PM  Jorge Welch  has presented today for surgery, with the diagnosis of LEFT RENAL MASS.  The various methods of treatment have been discussed with the patient and family. After consideration of risks, benefits and other options for treatment, the patient has consented to  Procedure(s): XI ROBOT ASSITED LAPAROSCOPIC NEPHROURETERECTOMY (Left) as a surgical intervention.  The patient's history has been reviewed, patient examined, no change in status, stable for surgery.  I have reviewed the patient's chart and labs.  Questions were answered to the patient's satisfaction.     Ardis Hughs

## 2018-07-23 NOTE — H&P (Signed)
48 year old otherwise healthy male with a past medical history of diabetes who presented with gross hematuria. CT scan ultimately was performed and demonstrated an infiltrative left renal mass concerning for transitional cell carcinoma. The mass is extending medially into the hilum with question of invasion into a secondary renal vein.  The patient is very active, and has not had any associated pain. He denies any night sweats or weight loss. He has not seen any hematuria in quite some time, but was passing clots at 1 point. He has no significant past medical history beyond diabetes and hypertension. He denies a smoking history or exposure and any toxic chemicals. 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 He is married, his daughter has graduated from high school next week.   Patient has not witnessed any gross hematuria since his last visit.     ALLERGIES: None   MEDICATIONS: Metformin Hcl 500 mg tablet  Allergy  Atorvastatin Calcium 20 mg tablet  Lisinopril-Hydrochlorothiazide 20 mg-25 mg tablet     GU PSH: Locm 300-399Mg /Ml Iodine,1Ml - 07/03/2018    NON-GU PSH: None   GU PMH: Gross hematuria - 06/18/2018 Incomplete bladder emptying - 06/18/2018 Urinary Frequency - 06/18/2018    NON-GU PMH: Diabetes Type 2 GERD Hypercholesterolemia Hypertension    FAMILY HISTORY: None   SOCIAL HISTORY: None   REVIEW OF SYSTEMS:    GU Review Male:   Patient denies frequent urination, hard to postpone urination, burning/ pain with urination, get up at night to urinate, leakage of urine, stream starts and stops, trouble starting your stream, have to strain to urinate , erection problems, and penile pain.  Gastrointestinal (Upper):   Patient denies nausea, vomiting, and indigestion/ heartburn.  Gastrointestinal (Lower):   Patient denies diarrhea and constipation.  Constitutional:   Patient denies fever, night sweats, weight loss, and fatigue.  Skin:   Patient denies skin rash/  lesion and itching.  Eyes:   Patient denies blurred vision and double vision.  Ears/ Nose/ Throat:   Patient denies sore throat and sinus problems.  Hematologic/Lymphatic:   Patient denies swollen glands and easy bruising.  Cardiovascular:   Patient denies leg swelling and chest pains.  Respiratory:   Patient denies cough and shortness of breath.  Endocrine:   Patient denies excessive thirst.  Musculoskeletal:   Patient denies back pain and joint pain.  Neurological:   Patient denies headaches and dizziness.  Psychologic:   Patient denies depression and anxiety.   VITAL SIGNS:      07/09/2018 08:43 AM  Weight 178 lb / 80.74 kg  BP 146/89 mmHg  Pulse 71 /min  Temperature 98.4 F / 36.8 C   MULTI-SYSTEM PHYSICAL EXAMINATION:    Constitutional: Well-nourished. No physical deformities. Normally developed. Good grooming.  Neck: Neck symmetrical, not swollen. Normal tracheal position.  Respiratory: Normal breath sounds. No labored breathing, no use of accessory muscles.   Cardiovascular: Regular rate and rhythm. No murmur, no gallop. Normal temperature, normal extremity pulses, no swelling, no varicosities.   Lymphatic: No enlargement of neck, axillae, groin.  Skin: No paleness, no jaundice, no cyanosis. No lesion, no ulcer, no rash.  Neurologic / Psychiatric: Oriented to time, oriented to place, oriented to person. No depression, no anxiety, no agitation.  Gastrointestinal: No mass, no tenderness, no rigidity, non obese abdomen.  Eyes: Normal conjunctivae. Normal eyelids.  Ears, Nose, Mouth, and Throat: Left ear no scars, no lesions, no masses. Right ear no scars, no lesions, no masses.  Nose no scars, no lesions, no masses. Normal hearing. Normal lips.  Musculoskeletal: Normal gait and station of head and neck.     PAST DATA REVIEWED:  Source Of History:  Patient  Records Review:   Previous Doctor Records, Previous Patient Records, POC Tool  X-Ray Review: C.T. Abdomen/Pelvis: Reviewed  Films. Discussed With Patient.     PROCEDURES:         C.T. Chest w/o Contrast - 71250      Patient confirmed No Neulasta OnPro Device.           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 Chest Without Contrast    MRI Abdomen With and Without I.V. Contrast          Schedule         Document Letter(s):  Created for Patient: Clinical Summary         Notes:   The patient's hematuria is from a large infiltrating left renal mass which is likely emanating from the patient's urothelium consistent with a transitional cell carcinoma. There is some renal vein involvement and possibly invasion into a posterior lumbar vein, which on the CT scan is not entirely clear.   I spoke to the patient about the findings on the CT scan and my concern for this cancers. I suspect this is transitional cell carcinoma which would necessitate a nephro ureterectomy. However, this remains unclear currently and as such I have ordered a urine cytology to be performed in hopes of making a diagnosis of transitional carcinoma. I would also like to obtain a chest CT scan to ensure that there is no metastatic spread into the patient's chest. Further, I think it is worth obtaining an MRI to closely evaluate the vasculature in ensure that the tumors not invading deep into any of the hilar vessels.   I did go over robotic assisted laparoscopic nephro ureterectomy with the patient and his wife who was with [place] by phone. I explained the surgery as well as the expected recovery. The patient understands that he will need a  Foley catheter for 10 days following the operation. He understands the expected time for recovery is approximately 6 weeks. He may also need adjuvant chemotherapy. Further, we also discussed the kidney stones as seen in the right kidney. Those will also need to be addressed, but at this point is not priority form.   I would like to get him on her schedule quickly, and I will follow-up with him in regards to the test that we have ordered to finalize the plan. But tentatively, I have scheduled him for a robotic assisted left-sided nephro ureterectomy.

## 2018-07-24 ENCOUNTER — Encounter (HOSPITAL_COMMUNITY): Payer: Self-pay | Admitting: Urology

## 2018-07-24 LAB — HEMOGLOBIN AND HEMATOCRIT, BLOOD
HCT: 35.7 % — ABNORMAL LOW (ref 39.0–52.0)
Hemoglobin: 12 g/dL — ABNORMAL LOW (ref 13.0–17.0)

## 2018-07-24 LAB — BASIC METABOLIC PANEL
Anion gap: 9 (ref 5–15)
BUN: 17 mg/dL (ref 6–20)
CO2: 24 mmol/L (ref 22–32)
Calcium: 8.6 mg/dL — ABNORMAL LOW (ref 8.9–10.3)
Chloride: 104 mmol/L (ref 98–111)
Creatinine, Ser: 1.47 mg/dL — ABNORMAL HIGH (ref 0.61–1.24)
GFR calc Af Amer: >60 mL/min (ref >60)
GFR calc non Af Amer: 55 mL/min — ABNORMAL LOW (ref >60)
Glucose, Bld: 139 mg/dL — ABNORMAL HIGH (ref 70–99)
Potassium: 3.8 mmol/L (ref 3.5–5.1)
Sodium: 137 mmol/L (ref 135–145)

## 2018-07-24 LAB — GLUCOSE, CAPILLARY
Glucose-Capillary: 122 mg/dL — ABNORMAL HIGH (ref 70–99)
Glucose-Capillary: 157 mg/dL — ABNORMAL HIGH (ref 70–99)

## 2018-07-24 NOTE — Discharge Instructions (Signed)
Activity:  You are encouraged to ambulate frequently (about every hour during waking hours) to help prevent blood clots from forming in your legs or lungs.  However, you should not engage in any heavy lifting (> 10-15 lbs), strenuous activity, or straining. Diet: You should continue a clear liquid diet until passing gas from below.  Once this occurs, you may advance your diet to a soft diet that would be easy to digest (i.e soups, scrambled eggs, mashed potatoes, etc.) for 24 hours just as you would if getting over a bad stomach flu.  If tolerating this diet well for 24 hours, you may then begin eating regular food.  It will be normal to have some amount of bloating, nausea, and abdominal discomfort intermittently. Prescriptions:  You will be provided a prescription for pain medication to take as needed.  If your pain is not severe enough to require the prescription pain medication, you may take Tylenol instead.  You should also take an over the counter stool softener (Colace 100 mg twice daily) to avoid straining with bowel movements as the pain medication may constipate you.  Catheter care: You will be taught how to take care of the catheter by the nursing staff prior to discharge from the hospital.  You may use both a leg bag and the larger bedside bag but it is recommended to at least use the bigger bedside bag at nighttime as the leg bag is small and will fill up overnight and also does not drain as well when lying flat. You may periodically feel a strong urge to void with the catheter in place.  This is a bladder spasm and most often can occur when having a bowel movement or when you are moving around. It is typically self-limited and usually will stop after a few minutes.  You may use some Vaseline or Neosporin around the tip of the catheter to reduce friction at the tip of the penis. Incisions: You may remove your dressing bandages the 2nd day after surgery.  You most likely will have a few small  staples in each of the incisions and once the bandages are removed, the incisions may stay open to air.  You may start showering (not soaking or bathing in water) 48 hours after surgery and the incisions simply need to be patted dry after the shower.  No additional care is needed. What to call us about: You should call the office [phone]) if you develop fever > 101, persistent vomiting, or the catheter stops draining. Also, feel free to call with any other questions you may have and remember the handout that was provided to you as a reference preoperatively which answers many of the common questions that arise after surgery. You may resume aspirin, advil, aleve, vitamins, and supplements 7 days after surgery.  Foley Catheter Care A soft, flexible tube (Foley catheter) may have been placed in your bladder to drain urine and fluid. Follow these instructions: Taking Care of the Catheter  Keep the area where the catheter leaves your body clean.   Attach the catheter to the leg so there is no tension on the catheter.   Keep the drainage bag below the level of the bladder, but keep it OFF the floor.   Do not take long soaking baths. Your caregiver will give instructions about showering.   Wash your hands before touching ANYTHING related to the catheter or bag.   Using mild soap and warm water on a washcloth:   Clean the  area closest to the catheter insertion site using a circular motion around the catheter.   Clean the catheter itself by wiping AWAY from the insertion site for several inches down the tube.   NEVER wipe upward as this could sweep bacteria up into the urethra (tube in your body that normally drains the bladder) and cause infection.   Place a small amount of sterile lubricant at the tip of the penis where the catheter is entering.  Taking Care of the Drainage Bags  Two drainage bags may be taken home: a large overnight drainage bag, and a smaller leg bag which fits  underneath clothing.   It is okay to wear the overnight bag at any time, but NEVER wear the smaller leg bag at night.   Keep the drainage bag well below the level of your bladder. This prevents backflow of urine into the bladder and allows the urine to drain freely.   Anchor the tubing to your leg to prevent pulling or tension on the catheter. Use tape or a leg strap provided by the hospital.   Empty the drainage bag when it is 1/2 to 3/4 full. Wash your hands before and after touching the bag.   Periodically check the tubing for kinks to make sure there is no pressure on the tubing which could restrict the flow of urine.  Changing the Drainage Bags  Cleanse both ends of the clean bag with alcohol before changing.   Pinch off the rubber catheter to avoid urine spillage during the disconnection.   Disconnect the dirty bag and connect the clean one.   Empty the dirty bag carefully to avoid a urine spill.   Attach the new bag to the leg with tape or a leg strap.  Cleaning the Drainage Bags  Whenever a drainage bag is disconnected, it must be cleaned quickly so it is ready for the next use.   Wash the bag in warm, soapy water.   Rinse the bag thoroughly with warm water.   Soak the bag for 30 minutes in a solution of white vinegar and water (1 cup vinegar to 1 quart warm water).   Rinse with warm water.  SEEK MEDICAL CARE IF:   You have chills or night sweats.   You are leaking around your catheter or have problems with your catheter. It is not uncommon to have sporadic leakage around your catheter as a result of bladder spasms. If the leakage stops, there is not much need for concern. If you are uncertain, call your caregiver.   You develop side effects that you think are coming from your medicines.  SEEK IMMEDIATE MEDICAL CARE IF:   You are suddenly unable to urinate. Check to see if there are any kinks in the drainage tubing that may cause this. If you cannot find any kinks,  call your caregiver immediately. This is an emergency.   You develop shortness of breath or chest pains.   Bleeding persists or clots develop in your urine.   You have a fever.   You develop pain in your back or over your lower belly (abdomen).   You develop pain or swelling in your legs.   Any problems you are having get worse rather than better.  MAKE SURE YOU:   Understand these instructions.   Will watch your condition.   Will get help right away if you are not doing well or get worse.

## 2018-07-24 NOTE — Discharge Summary (Signed)
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 Renal mass 07/23/2018    Procedures performed: Procedure(s): XI ROBOT ASSITED LAPAROSCOPIC NEPHROURETERECTOMY  History and Physical: For full details, please see admission history and physical. Briefly, Jorge Welch is a 48 y.o. year old patient with large renal mass in the left kidney.   Hospital Course: Patient tolerated the procedure well.  He was then transferred to the floor after an uneventful PACU stay.  His hospital course was uncomplicated.  On POD#1 he had met discharge criteria: was eating a regular diet, was up and ambulating independently,  pain was well controlled,  and was ready to for discharge.   Laboratory values:  Recent Labs    07/23/18 1716 07/24/18 0457  HGB 12.5* 12.0*  HCT 36.8* 35.7*   Recent Labs    07/24/18 0457  NA 137  K 3.8  CL 104  CO2 24  GLUCOSE 139*  BUN 17  CREATININE 1.47*  CALCIUM 8.6*   No results for input(s): LABPT, INR in the last 72 hours. No results for input(s): LABURIN in the last 72 hours. Results for orders placed or performed during the hospital encounter of 07/20/18  Novel Coronavirus, NAA (hospital order; send-out to ref lab)     Status: None   Collection Time: 07/20/18 10:09 AM  Result Value Ref Range Status   SARS-CoV-2, NAA NOT DETECTED NOT DETECTED Final    Comment: (NOTE) This test was developed and its performance characteristics determined by [employer]. This test has not been FDA cleared or approved. This test has been authorized by FDA under an Emergency Use Authorization (EUA). This test is only authorized for the duration of time the declaration that circumstances exist justifying the authorization of the emergency use of in vitro diagnostic tests for detection  of SARS-CoV-2 virus and/or diagnosis of COVID-19 infection under section 564(b)(1) of the Act, 21 U.S.C. 086VHQ-4(O)(9), unless the authorization is terminated or revoked sooner. When diagnostic testing is negative, the possibility of a false negative result should be considered in the context of a patient's recent exposures and the presence of clinical signs and symptoms consistent with COVID-19. An individual without symptoms of COVID-19 and who is not shedding SARS-CoV-2 virus would expect to have a negative (not detected) result in this assay. Performed  At: [facility] [address] [place], [place] [id number] Rush Farmer MD KG:[id number]    [place]  Final    Comment: Performed at [facility], [place] [address] [place], [place] [address]    Disposition: Home  Discharge instruction: The patient was instructed to be ambulatory but told to refrain from heavy lifting, strenuous activity, or driving.   Discharge medications:  Allergies as of 07/24/2018   No Known Allergies     Medication List    TAKE these medications   atorvastatin 20 MG tablet Commonly known as:  LIPITOR Take 20 mg by mouth every morning.   diphenhydrAMINE 25 mg capsule Commonly known as:  BENADRYL Take 25 mg by mouth daily as needed for allergies. Equate Allergy Relief   lisinopril-hydrochlorothiazide 20-25 MG tablet Commonly known as:  ZESTORETIC Take 1 tablet by mouth daily.   metFORMIN 500 MG 24 hr tablet Commonly known as:  GLUCOPHAGE-XR Take 1,000 mg by mouth 2 (two) times a day.   sulfamethoxazole-trimethoprim 800-160  MG tablet Commonly known as:  BACTRIM DS Take 1 tablet by mouth 2 (two) times daily. Start the day prior to foley removal appointment   traMADol 50 MG tablet Commonly known as:  Ultram Take 1-2 tablets (50-100 mg total) by mouth every 6 (six) hours as needed for moderate pain or severe pain.       Followup:  Follow-up  Information    Karen Kays, NP On 08/03/2018.   Specialty:  Nurse Practitioner Why:  at 11:00 Contact information: [facility] [address] [place] [address] [phone]

## 2018-07-24 NOTE — Progress Notes (Signed)
Urology Inpatient Progress Report  LEFT RENAL MASS  Procedure(s): XI ROBOT ASSITED LAPAROSCOPIC NEPHROURETERECTOMY  1 Day Post-Op   Intv/Subj: No acute events overnight. Patient is without complaint. Some soreness. Hasn't walked yet.  Active Problems:   Renal mass  Current Facility-Administered Medications  Medication Dose Route Frequency Provider Last Rate Last Dose  . 0.45 % sodium chloride infusion   Intravenous Continuous Debbrah Alar, PA-C 100 mL/hr at 07/24/18 0711    . acetaminophen (OFIRMEV) IV 1,000 mg  1,000 mg Intravenous Q6H Dancy, Amanda, PA-C 400 mL/hr at 07/24/18 0523 1,000 mg at 07/24/18 0523  . atorvastatin (LIPITOR) tablet 20 mg  20 mg Oral Daily Dancy, Amanda, PA-C      . diphenhydrAMINE (BENADRYL) injection 12.5-25 mg  12.5-25 mg Intravenous Q6H PRN Dancy, Amanda, PA-C       Or  . diphenhydrAMINE (BENADRYL) 12.5 MG/5ML elixir 12.5-25 mg  12.5-25 mg Oral Q6H PRN Dancy, Amanda, PA-C      . docusate sodium (COLACE) capsule 100 mg  100 mg Oral BID Debbrah Alar, PA-C   100 mg at 07/23/18 2052  . HYDROmorphone (DILAUDID) injection 0.5-1 mg  0.5-1 mg Intravenous Q2H PRN Dancy, Amanda, PA-C      . insulin aspart (novoLOG) injection 0-15 Units  0-15 Units Subcutaneous TID WC Dancy, Amanda, PA-C      . ketorolac (TORADOL) 15 MG/ML injection 15 mg  15 mg Intravenous Q6H Dancy, Amanda, PA-C   15 mg at 07/24/18 9924  . neomycin-bacitracin-polymyxin (NEOSPORIN) ointment packet 1 application  1 application Topical TID PRN Debbrah Alar, PA-C      . ondansetron (ZOFRAN) injection 4 mg  4 mg Intravenous Q4H PRN Dancy, Amanda, PA-C      . opium-belladonna (B&O SUPPRETTES) 16.2-60 MG suppository 1 suppository  1 suppository Rectal Q6H PRN Debbrah Alar, PA-C      . traMADol (ULTRAM) tablet 50-100 mg  50-100 mg Oral Q6H PRN Debbrah Alar, PA-C         Objective: Vital: Vitals:   07/23/18 1924 07/23/18 2209 07/24/18 0220 07/24/18 0538  BP: (!) 165/96 (!) 161/99 131/86 (!)  158/95  Pulse: 92 67 63 (!) 59  Resp:  16 14 16   Temp: 99 F (37.2 C) 98.3 F (36.8 C) 98.7 F (37.1 C) 98.9 F (37.2 C)  TempSrc: Oral Oral Oral Oral  SpO2: 100% 100% 100% 100%  Weight:      Height:       I/Os: I/O last 3 completed shifts: In: 4241.5 [I.V.:3941.5; IV QASTMHDQQ:229] Out: 2000 [Urine:1900; Blood:100]  Physical Exam:  General: Patient is in no apparent distress Lungs: Normal respiratory effort, chest expands symmetrically. GI: Incisions are c/d/i.  The abdomen is soft. JP drain with serosanguinous drainage Foley: clear yellow urine Ext: lower extremities symmetric  Lab Results: Recent Labs    07/23/18 1716 07/24/18 0457  HGB 12.5* 12.0*  HCT 36.8* 35.7*   Recent Labs    07/24/18 0457  NA 137  K 3.8  CL 104  CO2 24  GLUCOSE 139*  BUN 17  CREATININE 1.47*  CALCIUM 8.6*   No results for input(s): LABPT, INR in the last 72 hours. No results for input(s): LABURIN in the last 72 hours. Results for orders placed or performed during the hospital encounter of 07/20/18  Novel Coronavirus, NAA (hospital order; send-out to ref lab)     Status: None   Collection Time: 07/20/18 10:09 AM  Result Value Ref Range Status   SARS-CoV-2, NAA NOT DETECTED  NOT DETECTED Final    Comment: (NOTE) This test was developed and its performance characteristics determined by [employer]. This test has not been FDA cleared or approved. This test has been authorized by FDA under an Emergency Use Authorization (EUA). This test is only authorized for the duration of time the declaration that circumstances exist justifying the authorization of the emergency use of in vitro diagnostic tests for detection of SARS-CoV-2 virus and/or diagnosis of COVID-19 infection under section 564(b)(1) of the Act, 21 U.S.C. 161WRU-0(A)(5), unless the authorization is terminated or revoked sooner. When diagnostic testing is negative, the possibility of a false negative result should  be considered in the context of a patient's recent exposures and the presence of clinical signs and symptoms consistent with COVID-19. An individual without symptoms of COVID-19 and who is not shedding SARS-CoV-2 virus would expect to have a negative (not detected) result in this assay. Performed  At: [facility] [address] [place], [place] [id number] Rush Farmer MD NW:[id number]    [place]  Final    Comment: Performed at [facility], [place] [address], [place], [place] [address]    Studies/Results: No results found.  Assessment: Procedure(s): XI ROBOT ASSITED LAPAROSCOPIC NEPHROURETERECTOMY, 1 Day Post-Op  doing well.  Plan: D/c JP drain Ambulate Advance diet Transition to oral pain medications Foley teaching (leg bag) Plan for discharge around lunch.   Louis Meckel, MD Urology 07/24/2018, 7:24 AM

## 2019-05-04 ENCOUNTER — Inpatient Hospital Stay (HOSPITAL_COMMUNITY): Payer: Commercial Managed Care - PPO | Admitting: Certified Registered"

## 2019-05-04 ENCOUNTER — Inpatient Hospital Stay (HOSPITAL_COMMUNITY): Payer: Commercial Managed Care - PPO

## 2019-05-04 ENCOUNTER — Inpatient Hospital Stay (HOSPITAL_COMMUNITY)
Admission: EM | Admit: 2019-05-04 | Discharge: 2019-05-11 | DRG: 853 | Disposition: A | Discharge status: Home / Self Care | Payer: Commercial Managed Care - PPO | Source: Ambulatory Visit | Attending: Internal Medicine | Admitting: Internal Medicine

## 2019-05-04 ENCOUNTER — Emergency Department (HOSPITAL_COMMUNITY): Payer: Commercial Managed Care - PPO

## 2019-05-04 ENCOUNTER — Other Ambulatory Visit: Payer: Self-pay

## 2019-05-04 ENCOUNTER — Encounter (HOSPITAL_COMMUNITY)
Admission: EM | Disposition: A | Discharge status: Home / Self Care | Payer: Self-pay | Source: Ambulatory Visit | Attending: Internal Medicine

## 2019-05-04 ENCOUNTER — Encounter (HOSPITAL_COMMUNITY): Payer: Self-pay | Admitting: Emergency Medicine

## 2019-05-04 DIAGNOSIS — Z79899 Other long term (current) drug therapy: Secondary | ICD-10-CM

## 2019-05-04 DIAGNOSIS — E875 Hyperkalemia: Secondary | ICD-10-CM | POA: Diagnosis present

## 2019-05-04 DIAGNOSIS — R0902 Hypoxemia: Secondary | ICD-10-CM

## 2019-05-04 DIAGNOSIS — Z87442 Personal history of urinary calculi: Secondary | ICD-10-CM

## 2019-05-04 DIAGNOSIS — N289 Disorder of kidney and ureter, unspecified: Secondary | ICD-10-CM

## 2019-05-04 DIAGNOSIS — I4891 Unspecified atrial fibrillation: Secondary | ICD-10-CM | POA: Diagnosis not present

## 2019-05-04 DIAGNOSIS — J189 Pneumonia, unspecified organism: Secondary | ICD-10-CM | POA: Diagnosis not present

## 2019-05-04 DIAGNOSIS — B9620 Unspecified Escherichia coli [E. coli] as the cause of diseases classified elsewhere: Secondary | ICD-10-CM | POA: Diagnosis present

## 2019-05-04 DIAGNOSIS — N170 Acute kidney failure with tubular necrosis: Secondary | ICD-10-CM | POA: Diagnosis present

## 2019-05-04 DIAGNOSIS — N178 Other acute kidney failure: Secondary | ICD-10-CM | POA: Diagnosis not present

## 2019-05-04 DIAGNOSIS — Z992 Dependence on renal dialysis: Secondary | ICD-10-CM

## 2019-05-04 DIAGNOSIS — Z8559 Personal history of malignant neoplasm of other urinary tract organ: Secondary | ICD-10-CM | POA: Diagnosis not present

## 2019-05-04 DIAGNOSIS — N136 Pyonephrosis: Secondary | ICD-10-CM | POA: Diagnosis present

## 2019-05-04 DIAGNOSIS — N10 Acute tubulo-interstitial nephritis: Secondary | ICD-10-CM | POA: Diagnosis not present

## 2019-05-04 DIAGNOSIS — N138 Other obstructive and reflux uropathy: Secondary | ICD-10-CM | POA: Diagnosis present

## 2019-05-04 DIAGNOSIS — E119 Type 2 diabetes mellitus without complications: Secondary | ICD-10-CM | POA: Diagnosis present

## 2019-05-04 DIAGNOSIS — N3081 Other cystitis with hematuria: Secondary | ICD-10-CM | POA: Diagnosis not present

## 2019-05-04 DIAGNOSIS — Z20822 Contact with and (suspected) exposure to covid-19: Secondary | ICD-10-CM | POA: Diagnosis not present

## 2019-05-04 DIAGNOSIS — E785 Hyperlipidemia, unspecified: Secondary | ICD-10-CM | POA: Diagnosis present

## 2019-05-04 DIAGNOSIS — Z905 Acquired absence of kidney: Secondary | ICD-10-CM

## 2019-05-04 DIAGNOSIS — E872 Acidosis: Secondary | ICD-10-CM | POA: Diagnosis not present

## 2019-05-04 DIAGNOSIS — Z906 Acquired absence of other parts of urinary tract: Secondary | ICD-10-CM | POA: Diagnosis not present

## 2019-05-04 DIAGNOSIS — N35911 Unspecified urethral stricture, male, meatal: Secondary | ICD-10-CM | POA: Diagnosis not present

## 2019-05-04 DIAGNOSIS — R1031 Right lower quadrant pain: Secondary | ICD-10-CM | POA: Diagnosis present

## 2019-05-04 DIAGNOSIS — N201 Calculus of ureter: Secondary | ICD-10-CM

## 2019-05-04 DIAGNOSIS — A419 Sepsis, unspecified organism: Secondary | ICD-10-CM | POA: Diagnosis not present

## 2019-05-04 DIAGNOSIS — I10 Essential (primary) hypertension: Secondary | ICD-10-CM | POA: Diagnosis present

## 2019-05-04 DIAGNOSIS — R6510 Systemic inflammatory response syndrome (SIRS) of non-infectious origin without acute organ dysfunction: Secondary | ICD-10-CM | POA: Diagnosis not present

## 2019-05-04 DIAGNOSIS — Z7984 Long term (current) use of oral hypoglycemic drugs: Secondary | ICD-10-CM | POA: Diagnosis not present

## 2019-05-04 DIAGNOSIS — J9601 Acute respiratory failure with hypoxia: Secondary | ICD-10-CM | POA: Diagnosis not present

## 2019-05-04 DIAGNOSIS — R9431 Abnormal electrocardiogram [ECG] [EKG]: Secondary | ICD-10-CM | POA: Diagnosis not present

## 2019-05-04 DIAGNOSIS — N179 Acute kidney failure, unspecified: Secondary | ICD-10-CM | POA: Diagnosis not present

## 2019-05-04 DIAGNOSIS — N200 Calculus of kidney: Secondary | ICD-10-CM

## 2019-05-04 DIAGNOSIS — E139 Other specified diabetes mellitus without complications: Secondary | ICD-10-CM | POA: Diagnosis not present

## 2019-05-04 DIAGNOSIS — R6520 Severe sepsis without septic shock: Secondary | ICD-10-CM | POA: Diagnosis not present

## 2019-05-04 HISTORY — PX: CYSTOSCOPY/URETEROSCOPY/HOLMIUM LASER/STENT PLACEMENT: SHX6546

## 2019-05-04 LAB — URINALYSIS, ROUTINE W REFLEX MICROSCOPIC
Bilirubin Urine: NEGATIVE
Glucose, UA: NEGATIVE mg/dL
Ketones, ur: NEGATIVE mg/dL
Nitrite: NEGATIVE
Protein, ur: 100 mg/dL — AB
Specific Gravity, Urine: <1.005 — ABNORMAL LOW (ref 1.005–1.030)
pH: 5.5 (ref 5.0–8.0)

## 2019-05-04 LAB — COMPREHENSIVE METABOLIC PANEL
ALT: 23 U/L (ref 0–44)
AST: 24 U/L (ref 15–41)
Albumin: 4.2 g/dL (ref 3.5–5.0)
Alkaline Phosphatase: 61 U/L (ref 38–126)
Anion gap: 11 (ref 5–15)
BUN: 43 mg/dL — ABNORMAL HIGH (ref 6–20)
CO2: 26 mmol/L (ref 22–32)
Calcium: 9.7 mg/dL (ref 8.9–10.3)
Chloride: 102 mmol/L (ref 98–111)
Creatinine, Ser: 5.28 mg/dL — ABNORMAL HIGH (ref 0.61–1.24)
GFR calc Af Amer: 13 mL/min — ABNORMAL LOW (ref >60)
GFR calc non Af Amer: 12 mL/min — ABNORMAL LOW (ref >60)
Glucose, Bld: 182 mg/dL — ABNORMAL HIGH (ref 70–99)
Potassium: 5.6 mmol/L — ABNORMAL HIGH (ref 3.5–5.1)
Sodium: 139 mmol/L (ref 135–145)
Total Bilirubin: 2.1 mg/dL — ABNORMAL HIGH (ref 0.3–1.2)
Total Protein: 7.3 g/dL (ref 6.5–8.1)

## 2019-05-04 LAB — CBC WITH DIFFERENTIAL/PLATELET
Abs Immature Granulocytes: 0.21 10*3/uL — ABNORMAL HIGH (ref 0.00–0.07)
Basophils Absolute: 0 10*3/uL (ref 0.0–0.1)
Basophils Relative: 0 %
Eosinophils Absolute: 0 10*3/uL (ref 0.0–0.5)
Eosinophils Relative: 0 %
HCT: 41.8 % (ref 39.0–52.0)
Hemoglobin: 14.7 g/dL (ref 13.0–17.0)
Immature Granulocytes: 1 %
Lymphocytes Relative: 3 %
Lymphs Abs: 0.5 10*3/uL — ABNORMAL LOW (ref 0.7–4.0)
MCH: 32.2 pg (ref 26.0–34.0)
MCHC: 35.2 g/dL (ref 30.0–36.0)
MCV: 91.7 fL (ref 80.0–100.0)
Monocytes Absolute: 0.7 10*3/uL (ref 0.1–1.0)
Monocytes Relative: 4 %
Neutro Abs: 16.5 10*3/uL — ABNORMAL HIGH (ref 1.7–7.7)
Neutrophils Relative %: 92 %
Platelets: 160 10*3/uL (ref 150–400)
RBC: 4.56 MIL/uL (ref 4.22–5.81)
RDW: 12.3 % (ref 11.5–15.5)
WBC: 17.9 10*3/uL — ABNORMAL HIGH (ref 4.0–10.5)
nRBC: 0 % (ref 0.0–0.2)

## 2019-05-04 LAB — URINALYSIS, MICROSCOPIC (REFLEX): WBC, UA: >50 WBC/hpf (ref 0–5)

## 2019-05-04 LAB — CK: Total CK: 107 U/L (ref 49–397)

## 2019-05-04 LAB — RESPIRATORY PANEL BY RT PCR (FLU A&B, COVID)
Influenza A by PCR: NEGATIVE
Influenza B by PCR: NEGATIVE
SARS Coronavirus 2 by RT PCR: NEGATIVE

## 2019-05-04 LAB — GLUCOSE, CAPILLARY: Glucose-Capillary: 119 mg/dL — ABNORMAL HIGH (ref 70–99)

## 2019-05-04 LAB — LIPASE, BLOOD: Lipase: 23 U/L (ref 11–51)

## 2019-05-04 SURGERY — CYSTOSCOPY/URETEROSCOPY/HOLMIUM LASER/STENT PLACEMENT
Anesthesia: General | Laterality: Right

## 2019-05-04 MED ORDER — MIDAZOLAM HCL 2 MG/2ML IJ SOLN
INTRAMUSCULAR | Status: DC | PRN
  Administered 2019-05-04: 1 mg via INTRAVENOUS

## 2019-05-04 MED ORDER — MORPHINE SULFATE (PF) 4 MG/ML IV SOLN
4.0000 mg | Freq: Once | INTRAVENOUS | Status: AC
  Administered 2019-05-04: 4 mg via INTRAVENOUS
  Filled 2019-05-04: qty 1

## 2019-05-04 MED ORDER — DEXAMETHASONE SODIUM PHOSPHATE 10 MG/ML IJ SOLN
INTRAMUSCULAR | Status: DC | PRN
  Administered 2019-05-04: 5 mg via INTRAVENOUS

## 2019-05-04 MED ORDER — FENTANYL CITRATE (PF) 100 MCG/2ML IJ SOLN
INTRAMUSCULAR | Status: DC | PRN
  Administered 2019-05-04 (×2): 50 ug via INTRAVENOUS

## 2019-05-04 MED ORDER — SODIUM CHLORIDE 0.9 % IV SOLN: INTRAVENOUS | Status: DC

## 2019-05-04 MED ORDER — ACETAMINOPHEN 10 MG/ML IV SOLN
1000.0000 mg | Freq: Once | INTRAVENOUS | Status: AC
  Administered 2019-05-04: 1000 mg via INTRAVENOUS

## 2019-05-04 MED ORDER — ACETAMINOPHEN 10 MG/ML IV SOLN
INTRAVENOUS | Status: AC
  Filled 2019-05-04: qty 100

## 2019-05-04 MED ORDER — SODIUM CHLORIDE 0.9 % IV SOLN: INTRAVENOUS | Status: DC | PRN

## 2019-05-04 MED ORDER — PROPOFOL 10 MG/ML IV BOLUS
INTRAVENOUS | Status: DC | PRN
  Administered 2019-05-04: 150 mg via INTRAVENOUS

## 2019-05-04 MED ORDER — FENTANYL CITRATE (PF) 100 MCG/2ML IJ SOLN
INTRAMUSCULAR | Status: AC
  Filled 2019-05-04: qty 2

## 2019-05-04 MED ORDER — PHENYLEPHRINE HCL-NACL 10-0.9 MG/250ML-% IV SOLN
INTRAVENOUS | Status: DC | PRN
  Administered 2019-05-04: 25 ug/min via INTRAVENOUS

## 2019-05-04 MED ORDER — SODIUM CHLORIDE 0.9 % IV SOLN: Freq: Once | INTRAVENOUS | Status: AC

## 2019-05-04 MED ORDER — PHENYLEPHRINE 40 MCG/ML (10ML) SYRINGE FOR IV PUSH (FOR BLOOD PRESSURE SUPPORT)
PREFILLED_SYRINGE | INTRAVENOUS | Status: DC | PRN
  Administered 2019-05-04: 200 ug via INTRAVENOUS

## 2019-05-04 MED ORDER — IOHEXOL 300 MG/ML  SOLN
INTRAMUSCULAR | Status: DC | PRN
  Administered 2019-05-04: 10 mL via URETHRAL

## 2019-05-04 MED ORDER — SODIUM CHLORIDE 0.9 % IV SOLN
1.0000 g | Freq: Once | INTRAVENOUS | Status: AC
  Administered 2019-05-04: 1 g via INTRAVENOUS
  Filled 2019-05-04: qty 10

## 2019-05-04 MED ORDER — ONDANSETRON HCL 4 MG/2ML IJ SOLN
4.0000 mg | Freq: Once | INTRAMUSCULAR | Status: AC
  Administered 2019-05-04: 4 mg via INTRAVENOUS
  Filled 2019-05-04: qty 2

## 2019-05-04 MED ORDER — TAMSULOSIN HCL 0.4 MG PO CAPS
0.4000 mg | ORAL_CAPSULE | ORAL | Status: AC
  Administered 2019-05-04: 0.4 mg via ORAL
  Filled 2019-05-04: qty 1

## 2019-05-04 MED ORDER — ONDANSETRON HCL 4 MG/2ML IJ SOLN
INTRAMUSCULAR | Status: DC | PRN
  Administered 2019-05-04: 4 mg via INTRAVENOUS

## 2019-05-04 MED ORDER — SODIUM CHLORIDE 0.9 % IV SOLN
2.0000 g | INTRAVENOUS | Status: AC
  Administered 2019-05-05 – 2019-05-10 (×6): 2 g via INTRAVENOUS
  Filled 2019-05-04: qty 20
  Filled 2019-05-04 (×2): qty 2
  Filled 2019-05-04 (×4): qty 20

## 2019-05-04 MED ORDER — HYDROMORPHONE HCL 1 MG/ML IJ SOLN: 0.2500 mg | INTRAMUSCULAR | Status: DC | PRN

## 2019-05-04 MED ORDER — SODIUM CHLORIDE 0.9 % IV SOLN
1.0000 g | Freq: Once | INTRAVENOUS | Status: AC
  Administered 2019-05-05: 1 g via INTRAVENOUS
  Filled 2019-05-04: qty 1

## 2019-05-04 MED ORDER — PROPOFOL 10 MG/ML IV BOLUS
INTRAVENOUS | Status: AC
  Filled 2019-05-04: qty 20

## 2019-05-04 MED ORDER — MIDAZOLAM HCL 2 MG/2ML IJ SOLN
INTRAMUSCULAR | Status: AC
  Filled 2019-05-04: qty 2

## 2019-05-04 MED ORDER — LIDOCAINE 2% (20 MG/ML) 5 ML SYRINGE
INTRAMUSCULAR | Status: DC | PRN
  Administered 2019-05-04: 60 mg via INTRAVENOUS

## 2019-05-04 MED ORDER — SODIUM CHLORIDE 0.9 % IR SOLN
Status: DC | PRN
  Administered 2019-05-04: 3000 mL via INTRAVESICAL

## 2019-05-04 SURGICAL SUPPLY — 22 items
BAG URO CATCHER STRL LF (MISCELLANEOUS) ×3 IMPLANT
BASKET LASER NITINOL 1.9FR (BASKET) IMPLANT
BASKET ZERO TIP NITINOL 2.4FR (BASKET) IMPLANT
CATH FOLEY 2WAY SLVR 18FR 30CC (CATHETERS) ×2 IMPLANT
CATH INTERMIT  6FR 70CM (CATHETERS) ×3 IMPLANT
CLOTH BEACON ORANGE TIMEOUT ST (SAFETY) ×3 IMPLANT
EXTRACTOR STONE 1.7FRX115CM (UROLOGICAL SUPPLIES) IMPLANT
FIBER LASER FLEXIVA 365 (UROLOGICAL SUPPLIES) IMPLANT
FIBER LASER TRAC TIP (UROLOGICAL SUPPLIES) IMPLANT
GLOVE BIO SURGEON STRL SZ7.5 (GLOVE) ×3 IMPLANT
GOWN STRL REUS W/TWL XL LVL3 (GOWN DISPOSABLE) ×5 IMPLANT
GUIDEWIRE ANG ZIPWIRE 038X150 (WIRE) IMPLANT
GUIDEWIRE STR DUAL SENSOR (WIRE) ×5 IMPLANT
KIT TURNOVER KIT A (KITS) IMPLANT
MANIFOLD NEPTUNE II (INSTRUMENTS) ×3 IMPLANT
PACK CYSTO (CUSTOM PROCEDURE TRAY) ×3 IMPLANT
SHEATH URETERAL 12FRX28CM (UROLOGICAL SUPPLIES) IMPLANT
SHEATH URETERAL 12FRX35CM (MISCELLANEOUS) IMPLANT
STENT CONTOUR 6FRX24X.038 (STENTS) ×2 IMPLANT
TUBING CONNECTING 10 (TUBING) ×2 IMPLANT
TUBING CONNECTING 10' (TUBING) ×1
TUBING UROLOGY SET (TUBING) ×3 IMPLANT

## 2019-05-04 NOTE — Op Note (Signed)
Operative Note  Preoperative diagnosis:  1.  Right ureteral calculus 2.  Acute renal insufficiency  Post operative diagnosis: 1.  Right ureteral calculus 2.  Acute renal insufficiency 3.  Sepsis with urinary tract infection 4.  Meatal stenosis  Procedure(s): 1.  Cystoscopy with right retrograde pyelogram and right ureteral stent placement 2.  Urethral dilation  Surgeon: Link Snuffer, MD  Assistants: None  Anesthesia: General  Complications: None immediate  EBL: Minimal  Specimens: 1.  Urine culture  Drains/Catheters: 1.  6 X 24 double-J ureteral stent 2.  18 French Foley catheter  Intraoperative findings: 1.  Normal urethra 2.  Bladder was filled with cloudy urine.  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  I did not see an obvious mass but there was scattered erythema. 3.  right retrograde pyelogram revealed a filling defect at the level of the stone with upstream hydroureteronephrosis.  After relief of obstruction, there was efflux of purulent urine.  Indication: 49 year old male presenting with a 1 day history of flank pain.  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  He was found to have a distal right ureteral calculus.  He presents for urgent intervention.  Description of procedure:  The patient was identified and consent was obtained.  The patient was taken to the operating room and placed in the supine position.  The patient was placed under general anesthesia.  Perioperative antibiotics were administered.  The patient was placed in dorsal lithotomy.  Patient was prepped and draped in a standard sterile fashion and a timeout was performed.  A 21 French rigid cystoscope was advanced into the urethra and into the bladder.  Is immediately evident that there was cloudy urine consistent with possible UTI.  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  The patient also developed a low-grade temperature and was  tachycardic.  The right distal most portion of the ureter was cannulated with an open-ended ureteral catheter.  Retrograde pyelogram was performed with the findings noted above.  A sensor wire was then advanced up to the kidney under fluoroscopic guidance.  A 6 X 24 double-J ureteral stent was advanced up to the kidney under fluoroscopic guidance.  The wire was withdrawn and fluoroscopy confirmed good proximal placement and direct visualization confirmed a good coil within the bladder.  The bladder was drained and a urine culture was then obtained from the purulent urine coming from the kidney.  Foley catheter was placed after the scope was withdrawn.   This concluded the operation.  Patient tolerated procedure well but was on a small amount of pressors for drop in blood pressure after the retrograde pyelogram.  Plan: Continue antibiotics.  Recommend stepdown care as long as he does well.  ICU if he decompensates though I think he will recover.  Continue Foley catheter until creatinine improves and once he is stable to move around.

## 2019-05-04 NOTE — Anesthesia Procedure Notes (Signed)
Procedure Name: LMA Insertion Date/Time: 05/04/2019 10:32 PM Performed by: Cynda Familia, CRNA Pre-anesthesia Checklist: Patient identified, Emergency Drugs available, Suction available and Patient being monitored Patient Re-evaluated:Patient Re-evaluated prior to induction Oxygen Delivery Method: Circle System Utilized Preoxygenation: Pre-oxygenation with 100% oxygen Induction Type: IV induction Ventilation: Mask ventilation without difficulty LMA: LMA inserted and LMA with gastric port inserted LMA Size: 4.0 Number of attempts: 1 Placement Confirmation: positive ETCO2 Tube secured with: Tape Dental Injury: Teeth and Oropharynx as per pre-operative assessment

## 2019-05-04 NOTE — Anesthesia Postprocedure Evaluation (Signed)
Anesthesia Post Note  Patient: Nikolay Demetriou  Procedure(s) Performed: CYSTOSCOPY/URETEROSCOPY/STENT PLACEMENT (Right )     Patient location during evaluation: PACU Anesthesia Type: General Level of consciousness: awake and alert Pain management: pain level controlled Vital Signs Assessment: post-procedure vital signs reviewed and stable Respiratory status: spontaneous breathing, nonlabored ventilation, respiratory function stable and patient connected to nasal cannula oxygen Cardiovascular status: blood pressure returned to baseline and stable Postop Assessment: no apparent nausea or vomiting Anesthetic complications: no    Last Vitals:  Vitals:   05/04/19 2305 05/04/19 2315  BP:  130/88  Pulse:  (!) 124  Resp:  (!) 25  Temp:    SpO2: 100% 100%    Last Pain:  Vitals:   05/04/19 2315  TempSrc:   PainSc: 0-No pain                 Darthula Desa,W. EDMOND

## 2019-05-04 NOTE — H&P (Signed)
H&P Physician requesting consult: Lacretia Leigh, MD  Chief Complaint: Right ureteral calculus and a solitary kidney, AKI  History of Present Illness: 49 year old male presented with a 1 day history of severe right-sided flank pain.  He was found to have acute renal insufficiency with a creatinine of 5.28.  White blood cell count 17.9.  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  He was unable to provide a urine sample.  He is afebrile.  He does have some mild tachycardia.  He does not appear septic.  CT scan showed a 4 mm distal right ureteral calculus.  He has not really been voiding.  Past Medical History:  Diagnosis Date  . Diabetes mellitus without complication ([place])   . GERD (gastroesophageal reflux disease)   . History of kidney stones   . History of torsion of testis   . Hyperlipidemia   . Hypertension   . Left renal mass 06/2018   8.7 cm infiltrating left upper pole renal mass   . Seasonal allergies    Past Surgical History:  Procedure Laterality Date  . ROBOT ASSITED LAPAROSCOPIC NEPHROURETERECTOMY Left 07/23/2018   Procedure: XI ROBOT ASSITED LAPAROSCOPIC NEPHROURETERECTOMY;  Surgeon: Ardis Hughs, MD;  Location: [facility];  Service: Urology;  Laterality: Left;  . TESTICLE SURGERY     age 47  . WISDOM TOOTH EXTRACTION      Home Medications:  (Not in a hospital admission)  Allergies: No Known Allergies  History reviewed. No pertinent family history. Social History:  reports that he has never smoked. He has never used smokeless tobacco. He reports previous alcohol use. He reports that he does not use drugs.  ROS: A complete review of systems was performed.  All systems are negative except for pertinent findings as noted. ROS   Physical Exam:  Vital signs in last 24 hours: Temp:  [98.4 F (36.9 C)] 98.4 F (36.9 C) (03/16 1603) Pulse Rate:  [111-112] 111 (03/16 2125) Resp:  [18-19] 18 (03/16  2125) BP: (111-136)/(76-96) 136/96 (03/16 2125) SpO2:  [97 %-99 %] 97 % (03/16 2125) General:  Alert and oriented, No acute distress HEENT: Normocephalic, atraumatic Neck: No JVD or lymphadenopathy Cardiovascular: Regular rate and rhythm Lungs: Regular rate and effort Abdomen: Soft, nontender, nondistended, no abdominal masses Back: No CVA tenderness Extremities: No edema Neurologic: Grossly intact  Laboratory Data:  Results for orders placed or performed during the hospital encounter of 05/04/19 (from the past 24 hour(s))  Comprehensive metabolic panel     Status: Abnormal   Collection Time: 05/04/19  6:26 PM  Result Value Ref Range   Sodium 139 135 - 145 mmol/L   Potassium 5.6 (H) 3.5 - 5.1 mmol/L   Chloride 102 98 - 111 mmol/L   CO2 26 22 - 32 mmol/L   Glucose, Bld 182 (H) 70 - 99 mg/dL   BUN 43 (H) 6 - 20 mg/dL   Creatinine, Ser 5.28 (H) 0.61 - 1.24 mg/dL   Calcium 9.7 8.9 - 10.3 mg/dL   Total Protein 7.3 6.5 - 8.1 g/dL   Albumin 4.2 3.5 - 5.0 g/dL   AST 24 15 - 41 U/L   ALT 23 0 - 44 U/L   Alkaline Phosphatase 61 38 - 126 U/L   Total Bilirubin 2.1 (H) 0.3 - 1.2 mg/dL   GFR calc non Af Amer 12 (L) >60 mL/min   GFR calc Af Amer 13 (L) >60 mL/min   Anion gap 11 5 -  15  Lipase, blood     Status: None   Collection Time: 05/04/19  6:26 PM  Result Value Ref Range   Lipase 23 11 - 51 U/L  CK     Status: None   Collection Time: 05/04/19  6:26 PM  Result Value Ref Range   Total CK 107 49 - 397 U/L  CBC with Differential/Platelet     Status: Abnormal   Collection Time: 05/04/19  6:27 PM  Result Value Ref Range   WBC 17.9 (H) 4.0 - 10.5 K/uL   RBC 4.56 4.22 - 5.81 MIL/uL   Hemoglobin 14.7 13.0 - 17.0 g/dL   HCT 41.8 39.0 - 52.0 %   MCV 91.7 80.0 - 100.0 fL   MCH 32.2 26.0 - 34.0 pg   MCHC 35.2 30.0 - 36.0 g/dL   RDW 12.3 11.5 - 15.5 %   Platelets 160 150 - 400 K/uL   nRBC 0.0 0.0 - 0.2 %   Neutrophils Relative % 92 %   Neutro Abs 16.5 (H) 1.7 - 7.7 K/uL    Lymphocytes Relative 3 %   Lymphs Abs 0.5 (L) 0.7 - 4.0 K/uL   Monocytes Relative 4 %   Monocytes Absolute 0.7 0.1 - 1.0 K/uL   Eosinophils Relative 0 %   Eosinophils Absolute 0.0 0.0 - 0.5 K/uL   Basophils Relative 0 %   Basophils Absolute 0.0 0.0 - 0.1 K/uL   Immature Granulocytes 1 %   Abs Immature Granulocytes 0.21 (H) 0.00 - 0.07 K/uL  Respiratory Panel by RT PCR (Flu A&B, Covid) - Nasopharyngeal Swab     Status: None   Collection Time: 05/04/19  8:33 PM   Specimen: Nasopharyngeal Swab  Result Value Ref Range   SARS Coronavirus 2 by RT PCR NEGATIVE NEGATIVE   Influenza A by PCR NEGATIVE NEGATIVE   Influenza B by PCR NEGATIVE NEGATIVE   Recent Results (from the past 240 hour(s))  Respiratory Panel by RT PCR (Flu A&B, Covid) - Nasopharyngeal Swab     Status: None   Collection Time: 05/04/19  8:33 PM   Specimen: Nasopharyngeal Swab  Result Value Ref Range Status   SARS Coronavirus 2 by RT PCR NEGATIVE NEGATIVE Final    Comment: (NOTE) SARS-CoV-2 target nucleic acids are NOT DETECTED. The SARS-CoV-2 RNA is generally detectable in upper respiratoy specimens during the acute phase of infection. The lowest concentration of SARS-CoV-2 viral copies this assay can detect is 131 copies/mL. A negative result does not preclude SARS-Cov-2 infection and should not be used as the sole basis for treatment or other patient management decisions. A negative result may occur with  improper specimen collection/handling, submission of specimen other than nasopharyngeal swab, presence of viral mutation(s) within the areas targeted by this assay, and inadequate number of viral copies (<131 copies/mL). A negative result must be combined with clinical observations, patient history, and epidemiological information. The expected result is Negative. Fact Sheet for Patients:  PinkCheek.be Fact Sheet for Healthcare Providers:  GravelBags.it This  test is not yet ap proved or cleared by the [place] FDA and  has been authorized for detection and/or diagnosis of SARS-CoV-2 by FDA under an Emergency Use Authorization (EUA). This EUA will remain  in effect (meaning this test can be used) for the duration of the COVID-19 declaration under Section 564(b)(1) of the Act, 21 U.S.C. section 360bbb-3(b)(1), unless the authorization is terminated or revoked sooner.    Influenza A by PCR NEGATIVE NEGATIVE Final   Influenza B  by PCR NEGATIVE NEGATIVE Final    Comment: (NOTE) The Xpert Xpress SARS-CoV-2/FLU/RSV assay is intended as an aid in  the diagnosis of influenza from Nasopharyngeal swab specimens and  should not be used as a sole basis for treatment. Nasal washings and  aspirates are unacceptable for Xpert Xpress SARS-CoV-2/FLU/RSV  testing. Fact Sheet for Patients: PinkCheek.be Fact Sheet for Healthcare Providers: GravelBags.it This test is not yet approved or cleared by the [place] FDA and  has been authorized for detection and/or diagnosis of SARS-CoV-2 by  FDA under an Emergency Use Authorization (EUA). This EUA will remain  in effect (meaning this test can be used) for the duration of the  Covid-19 declaration under Section 564(b)(1) of the Act, 21  U.S.C. section 360bbb-3(b)(1), unless the authorization is  terminated or revoked. Performed at [facility], [place] [address] [place], [place] [address]    Creatinine: Recent Labs    05/04/19 1826  CREATININE 5.28*   CT scan personally reviewed and is detailed in the history of present illness  Impression/Assessment:  Right ureteral calculus Acute renal insufficiency  Plan:  Plan for urgent right ureteroscopy with laser lithotripsy and ureteral stent placement.  Given his tachycardia and mild leukocytosis, if there is any evidence of E flux of purulent urine, I will not perform a  ureteroscopy.  The stone is a distal stone, so I think there would be minimal manipulation of the stone and ureter and hopefully can take care of the stone without having to come back.  If there is any question, I told the patient that I would just place a stent given his renal insufficiency and solitary kidney status.  He expressed understanding.  Appreciate hospitalist service for assistance with this patient.  Marton Redwood, III 05/04/2019, 9:54 PM

## 2019-05-04 NOTE — Transfer of Care (Signed)
Immediate Anesthesia Transfer of Care Note  Patient: Jorge Welch  Procedure(s) Performed: CYSTOSCOPY/URETEROSCOPY/STENT PLACEMENT (Right )  Patient Location: PACU  Anesthesia Type:General  Level of Consciousness: sedated  Airway & Oxygen Therapy: Patient Spontanous Breathing and Patient connected to face mask oxygen  Post-op Assessment: Report given to RN and Post -op Vital signs reviewed and stable  Post vital signs: Reviewed and stable  Last Vitals:  Vitals Value Taken Time  BP 101/79 05/04/19 2301  Temp    Pulse 117 05/04/19 2302  Resp 17 05/04/19 2302  SpO2 98 % 05/04/19 2302  Vitals shown include unvalidated device data.  Last Pain:  Vitals:   05/04/19 1926  TempSrc:   PainSc: 2          Complications: No apparent anesthesia complications

## 2019-05-04 NOTE — ED Provider Notes (Signed)
Medical screening examination/treatment/procedure(s) were conducted as a shared visit with non-physician practitioner(s) and myself.  I personally evaluated the patient during the encounter.    49 year old male presents with right-sided flank pain.  Patient has evidence of kidney stone as well as acute kidney injury.  Case discussed with nephrology and he will come to see the patient taken to the OR and patient will be admitted   Lacretia Leigh, MD 05/04/19 2043

## 2019-05-04 NOTE — H&P (Signed)
History and Physical   Jorge Welch ZOX:[id number] DOB: 1970/05/23 DOA: 05/04/2019  Referring MD/NP/PA: Dr. Lacretia Leigh  PCP: Patient, No Pcp Per   Outpatient Specialists: None  Patient coming from: Home  Chief Complaint: Nausea vomiting and fever  HPI: Jorge Welch is a 49 y.o. male with medical history significant of diabetes, GERD, recurrent kidney stones, hypertension, left renal mass status post nephrectomy, solitary kidney on the right, history of testicular torsion who presents to the ER today with intractable nausea and vomiting.  Patient was found to be in acute renal failure.  Evaluation also showed obstructive uropathy once again with distal ureteric stone and hydronephrosis and the only remaining kidney.  Patient is having flank pain also.  Urology is therefore consulted on patient is being admitted to the medical service.  Plan is to take patient to the OR for insertion of J stent.  Suspected UTI.  Patient will be admitted to the medical service with urology consultation..  ED Course: Temperature 99.8 blood pressure 136/96 pulse 124 respirate 25 oxygen sat 95% room air.  COVID-19 is negative.  CT abdomen pelvis shows right obstructive uropathy with 4 mm stone in the distal right ureter causing mild hydronephrosis and moderate hydroureter.  White count is 17.9 otherwise CBC entirely within normal.  Sodium 139 potassium 5.6 chloride 102 CO2 26 glucose 182 creatinine 5.28.  Patient is therefore being admitted with obstructive uropathy leading to acute kidney injury hyperkalemia and hydronephrosis with hydroureter.  Review of Systems: As per HPI otherwise 10 point review of systems negative.    Past Medical History:  Diagnosis Date  . Diabetes mellitus without complication ([place])   . GERD (gastroesophageal reflux disease)   . History of kidney stones   . History of torsion of testis   . Hyperlipidemia   . Hypertension   . Left renal mass 06/2018   8.7 cm infiltrating  left upper pole renal mass   . Seasonal allergies     Past Surgical History:  Procedure Laterality Date  . ROBOT ASSITED LAPAROSCOPIC NEPHROURETERECTOMY Left 07/23/2018   Procedure: XI ROBOT ASSITED LAPAROSCOPIC NEPHROURETERECTOMY;  Surgeon: Ardis Hughs, MD;  Location: [facility];  Service: Urology;  Laterality: Left;  . TESTICLE SURGERY     age 40  . WISDOM TOOTH EXTRACTION       reports that he has never smoked. He has never used smokeless tobacco. He reports previous alcohol use. He reports that he does not use drugs.  No Known Allergies  History reviewed. No pertinent family history.   Prior to Admission medications   Medication Sig Start Date End Date Taking? Authorizing Provider  atorvastatin (LIPITOR) 20 MG tablet Take 20 mg by mouth every morning.   Yes [provider]  diphenhydrAMINE (BENADRYL) 25 mg capsule Take 25 mg by mouth daily as needed for allergies. Equate Allergy Relief   Yes [provider]  lisinopril-hydrochlorothiazide (ZESTORETIC) 20-25 MG tablet Take 1 tablet by mouth daily.   Yes [provider]  metFORMIN (GLUCOPHAGE-XR) 500 MG 24 hr tablet Take 1,000 mg by mouth every Monday, Wednesday, and Friday.    Yes [provider]  sulfamethoxazole-trimethoprim (BACTRIM DS) 800-160 MG tablet Take 1 tablet by mouth 2 (two) times daily. Start the day prior to foley removal appointment Patient not taking: Reported on 05/04/2019 07/23/18   Debbrah Alar, PA-C  traMADol (ULTRAM) 50 MG tablet Take 1-2 tablets (50-100 mg total) by mouth every 6 (six) hours as needed for moderate pain or severe  pain. Patient not taking: Reported on 05/04/2019 07/23/18   Debbrah Alar, PA-C    Physical Exam: Vitals:   05/04/19 2305 05/04/19 2315 05/04/19 2330 05/04/19 2345  BP:  130/88 135/81 118/80  Pulse:  (!) 124 (!) 118 (!) 116  Resp:  (!) 25 (!) 24 (!) 24  Temp:      TempSrc:      SpO2: 100% 100% 95% 96%      Constitutional: Acutely ill  looking, tremulous, in mild distress Vitals:   05/04/19 2305 05/04/19 2315 05/04/19 2330 05/04/19 2345  BP:  130/88 135/81 118/80  Pulse:  (!) 124 (!) 118 (!) 116  Resp:  (!) 25 (!) 24 (!) 24  Temp:      TempSrc:      SpO2: 100% 100% 95% 96%   Eyes: PERRL, lids and conjunctivae normal ENMT: Mucous membranes are dry. Posterior pharynx clear of any exudate or lesions.Normal dentition.  Neck: normal, supple, no masses, no thyromegaly Respiratory: clear to auscultation bilaterally, no wheezing, no crackles. Normal respiratory effort. No accessory muscle use.  Cardiovascular: Tachycardic, no murmurs / rubs / gallops. No extremity edema. 2+ pedal pulses. No carotid bruits.  Abdomen: no tenderness, no masses palpated. No hepatosplenomegaly. Bowel sounds positive.  Musculoskeletal: no clubbing / cyanosis. No joint deformity upper and lower extremities. Good ROM, no contractures. Normal muscle tone.  Skin: no rashes, lesions, ulcers. No induration Neurologic: CN 2-12 grossly intact. Sensation intact, DTR normal. Strength 5/5 in all 4.  Psychiatric: Very anxious    Labs on Admission: I have personally reviewed following labs and imaging studies  CBC: Recent Labs  Lab 05/04/19 1827  WBC 17.9*  NEUTROABS 16.5*  HGB 14.7  HCT 41.8  MCV 91.7  PLT 638   Basic Metabolic Panel: Recent Labs  Lab 05/04/19 1826  NA 139  K 5.6*  CL 102  CO2 26  GLUCOSE 182*  BUN 43*  CREATININE 5.28*  CALCIUM 9.7   GFR: CrCl cannot be calculated (Unknown ideal weight.). Liver Function Tests: Recent Labs  Lab 05/04/19 1826  AST 24  ALT 23  ALKPHOS 61  BILITOT 2.1*  PROT 7.3  ALBUMIN 4.2   Recent Labs  Lab 05/04/19 1826  LIPASE 23   No results for input(s): AMMONIA in the last 168 hours. Coagulation Profile: No results for input(s): INR, PROTIME in the last 168 hours. Cardiac Enzymes: Recent Labs  Lab 05/04/19 1826  CKTOTAL 107   BNP (last 3 results) No results for input(s):  PROBNP in the last 8760 hours. HbA1C: No results for input(s): HGBA1C in the last 72 hours. CBG: Recent Labs  Lab 05/04/19 2308  GLUCAP 119*   Lipid Profile: No results for input(s): CHOL, HDL, LDLCALC, TRIG, CHOLHDL, LDLDIRECT in the last 72 hours. Thyroid Function Tests: No results for input(s): TSH, T4TOTAL, FREET4, T3FREE, THYROIDAB in the last 72 hours. Anemia Panel: No results for input(s): VITAMINB12, FOLATE, FERRITIN, TIBC, IRON, RETICCTPCT in the last 72 hours. Urine analysis:    Component Value Date/Time   COLORURINE YELLOW 05/04/2019 1606   APPEARANCEUR CLOUDY (A) 05/04/2019 1606   LABSPEC <1.005 (L) 05/04/2019 1606   PHURINE 5.5 05/04/2019 1606   GLUCOSEU NEGATIVE 05/04/2019 1606   HGBUR LARGE (A) 05/04/2019 1606   BILIRUBINUR NEGATIVE 05/04/2019 1606   KETONESUR NEGATIVE 05/04/2019 1606   PROTEINUR 100 (A) 05/04/2019 1606   NITRITE NEGATIVE 05/04/2019 1606   LEUKOCYTESUR LARGE (A) 05/04/2019 1606   Sepsis Labs: @LABRCNTIP (procalcitonin:4,lacticidven:4) ) Recent Results (from the  past 240 hour(s))  Respiratory Panel by RT PCR (Flu A&B, Covid) - Nasopharyngeal Swab     Status: None   Collection Time: 05/04/19  8:33 PM   Specimen: Nasopharyngeal Swab  Result Value Ref Range Status   SARS Coronavirus 2 by RT PCR NEGATIVE NEGATIVE Final    Comment: (NOTE) SARS-CoV-2 target nucleic acids are NOT DETECTED. The SARS-CoV-2 RNA is generally detectable in upper respiratoy specimens during the acute phase of infection. The lowest concentration of SARS-CoV-2 viral copies this assay can detect is 131 copies/mL. A negative result does not preclude SARS-Cov-2 infection and should not be used as the sole basis for treatment or other patient management decisions. A negative result may occur with  improper specimen collection/handling, submission of specimen other than nasopharyngeal swab, presence of viral mutation(s) within the areas targeted by this assay, and inadequate  number of viral copies (<131 copies/mL). A negative result must be combined with clinical observations, patient history, and epidemiological information. The expected result is Negative. Fact Sheet for Patients:  PinkCheek.be Fact Sheet for Healthcare Providers:  GravelBags.it This test is not yet ap proved or cleared by the [place] FDA and  has been authorized for detection and/or diagnosis of SARS-CoV-2 by FDA under an Emergency Use Authorization (EUA). This EUA will remain  in effect (meaning this test can be used) for the duration of the COVID-19 declaration under Section 564(b)(1) of the Act, 21 U.S.C. section 360bbb-3(b)(1), unless the authorization is terminated or revoked sooner.    Influenza A by PCR NEGATIVE NEGATIVE Final   Influenza B by PCR NEGATIVE NEGATIVE Final    Comment: (NOTE) The Xpert Xpress SARS-CoV-2/FLU/RSV assay is intended as an aid in  the diagnosis of influenza from Nasopharyngeal swab specimens and  should not be used as a sole basis for treatment. Nasal washings and  aspirates are unacceptable for Xpert Xpress SARS-CoV-2/FLU/RSV  testing. Fact Sheet for Patients: PinkCheek.be Fact Sheet for Healthcare Providers: GravelBags.it This test is not yet approved or cleared by the [place] FDA and  has been authorized for detection and/or diagnosis of SARS-CoV-2 by  FDA under an Emergency Use Authorization (EUA). This EUA will remain  in effect (meaning this test can be used) for the duration of the  Covid-19 declaration under Section 564(b)(1) of the Act, 21  U.S.C. section 360bbb-3(b)(1), unless the authorization is  terminated or revoked. Performed at [facility], [place] [address], [place], [place] [address]      Radiological Exams on Admission: CT ABDOMEN PELVIS WO CONTRAST  Result Date: 05/04/2019 CLINICAL  DATA:  Right flank pain EXAM: CT ABDOMEN AND PELVIS WITHOUT CONTRAST TECHNIQUE: Multidetector CT imaging of the abdomen and pelvis was performed following the standard protocol without IV contrast. COMPARISON:  None. FINDINGS: LOWER CHEST: Normal. HEPATOBILIARY: Normal hepatic contours. No intra- or extrahepatic biliary dilatation. The gallbladder is normal. PANCREAS: Normal pancreas. No ductal dilatation or peripancreatic fluid collection. SPLEEN: Normal. ADRENALS/URINARY TRACT: The adrenal glands are normal. There is a stone within the distal right ureter measuring 4 mm, causing mild hydronephrosis and moderate hydroureter and mild perinephric stranding. Left kidney is surgically absent. No abnormality of the left renal fossa. The urinary bladder is normal for degree of distention STOMACH/BOWEL: There is no hiatal hernia. Normal duodenal course and caliber. No small bowel dilatation or inflammation. No focal colonic abnormality. Normal appendix. VASCULAR/LYMPHATIC: Normal course and caliber of the major abdominal vessels. No abdominal or pelvic lymphadenopathy. REPRODUCTIVE: Normal prostate size with symmetric  seminal vesicles. MUSCULOSKELETAL. No bony spinal canal stenosis or focal osseous abnormality. OTHER: None. IMPRESSION: 1. Right obstructive uropathy with 4 mm stone in the distal right ureter causing mild hydronephrosis and moderate hydroureter. 2. Status post left nephrectomy. Electronically Signed   By: Ulyses Jarred M.D.   On: 05/04/2019 20:05   DG C-Arm 1-60 Min-No Report  Result Date: 05/04/2019 Fluoroscopy was utilized by the requesting physician.  No radiographic interpretation.      Assessment/Plan Principal Problem:   Acute renal failure (ARF) (HCC) Active Problems:   Diabetes ([place])   Essential hypertension   Hyperlipidemia   Acute pyelonephritis     #1 acute renal failure: Appears to be secondary to obstructive uropathy in a solitary kidney individual.  Patient scheduled for J  stent placement.  Continue treatment per urology  #2 acute pyelonephritis: Suspected acute pyelonephritis.  Purulent urine noted at procedure.  IV Rocephin initiated will continue.  Await urine cultures and blood cultures.  #3 recurrent nephrolithiasis: Continue per urology.  #4 essential hypertension: Continue blood pressure control.  #5 hyperlipidemia: Resume home regimen when ready  #6 diabetes: Sliding scale insulin.  Follow closely.   DVT prophylaxis: Lovenox Code Status: Full code Family Communication: No family at bedside Disposition Plan: Home Consults called: Dr. Gloriann Loan, urology Admission status: Inpatient  Severity of Illness: The appropriate patient status for this patient is INPATIENT. Inpatient status is judged to be reasonable and necessary in order to provide the required intensity of service to ensure the patient's safety. The patient's presenting symptoms, physical exam findings, and initial radiographic and laboratory data in the context of their chronic comorbidities is felt to place them at high risk for further clinical deterioration. Furthermore, it is not anticipated that the patient will be medically stable for discharge from the hospital within 2 midnights of admission. The following factors support the patient status of inpatient.   " The patient's presenting symptoms include nausea with vomiting. " The worrisome physical exam findings include flank tenderness. " The initial radiographic and laboratory data are worrisome because of CT showing obstructive uropathy. " The chronic co-morbidities include recurrent nephrolithiasis and solitary kidney.   * I certify that at the point of admission it is my clinical judgment that the patient will require inpatient hospital care spanning beyond 2 midnights from the point of admission due to high intensity of service, high risk for further deterioration and high frequency of surveillance required.Barbette Merino  MD [facility] Pager [phone]  If 7PM-7AM, please contact night-coverage [email or web address] Password [facility]  05/05/2019, 12:03 AM

## 2019-05-04 NOTE — ED Provider Notes (Signed)
[facility] DEPT Provider Note   CSN: [id number] Arrival date & time: 05/04/19  1552     History Chief Complaint  Patient presents with  . Flank Pain    Jorge Welch is a 48 y.o. male.  HPI Patient is a 49 year old male with a history of DM, kidney stones, testicular torsion, HLD, HTN.  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  Patient states that he has had right flank pain and right-sided abdominal pain for the past 2 days.  Patient describes the pain as constant, achy, severe, worse with movement.  He states that he is unable to urinate, position however.  He states that there is some associated chills but no fever.  He states he has a history of kidney stones and was told that on a prior CT scan he had intrarenal kidney stones.  He is concerned that he has a kidney stone in his ureter today.  He states that he urinated twice yesterday.  States that he is only able to urinate drops of urine today.  States that it is dark brown in color.     Past Medical History:  Diagnosis Date  . Diabetes mellitus without complication ([place])   . GERD (gastroesophageal reflux disease)   . History of kidney stones   . History of torsion of testis   . Hyperlipidemia   . Hypertension   . Left renal mass 06/2018   8.7 cm infiltrating left upper pole renal mass   . Seasonal allergies     Patient Active Problem List   Diagnosis Date Noted  . ARF (acute renal failure) ([place]) 05/04/2019  . Renal mass 07/23/2018    Past Surgical History:  Procedure Laterality Date  . ROBOT ASSITED LAPAROSCOPIC NEPHROURETERECTOMY Left 07/23/2018   Procedure: XI ROBOT ASSITED LAPAROSCOPIC NEPHROURETERECTOMY;  Surgeon: Ardis Hughs, MD;  Location: [facility] ORS;  Service: Urology;  Laterality: Left;  . TESTICLE  SURGERY     age 41  . WISDOM TOOTH EXTRACTION         No family history on file.  Social History   Tobacco Use  . Smoking status: Never Smoker  . Smokeless tobacco: Never Used  Substance Use Topics  . Alcohol use: Not Currently  . Drug use: Never    Home Medications Prior to Admission medications   Medication Sig Start Date End Date Taking? Authorizing Provider  atorvastatin (LIPITOR) 20 MG tablet Take 20 mg by mouth every morning.   Yes [provider]  diphenhydrAMINE (BENADRYL) 25 mg capsule Take 25 mg by mouth daily as needed for allergies. Equate Allergy Relief   Yes [provider]  lisinopril-hydrochlorothiazide (ZESTORETIC) 20-25 MG tablet Take 1 tablet by mouth daily.   Yes [provider]  metFORMIN (GLUCOPHAGE-XR) 500 MG 24 hr tablet Take 1,000 mg by mouth every Monday, Wednesday, and Friday.    Yes [provider]  sulfamethoxazole-trimethoprim (BACTRIM DS) 800-160 MG tablet Take 1 tablet by mouth 2 (two) times daily. Start the day prior to foley removal appointment Patient not taking: Reported on 05/04/2019 07/23/18   Debbrah Alar, PA-C  traMADol (ULTRAM) 50 MG tablet Take 1-2 tablets (50-100 mg total) by mouth every 6 (six) hours as needed for moderate pain or severe pain. Patient not taking: Reported on 05/04/2019 07/23/18   Dancy,  Estill Bamberg, PA-C    Allergies    Patient has no known allergies.  Review of Systems   Review of Systems  Constitutional: Negative for chills and fever.  HENT: Negative for congestion.   Eyes: Negative for pain.  Respiratory: Negative for cough and shortness of breath.   Cardiovascular: Negative for chest pain and leg swelling.  Gastrointestinal: Negative for abdominal pain and vomiting.  Genitourinary: Positive for flank pain and hematuria [place] Shark urine possible hematuria). Negative for dysuria and testicular pain.  Musculoskeletal: Negative for myalgias.  Skin: Negative for rash.  Neurological:  Negative for dizziness and headaches.    Physical Exam Updated Vital Signs BP (!) 111/94   Pulse (!) 111   Temp 98.4 F (36.9 C) (Oral)   Resp 18   SpO2 99%   Physical Exam Vitals and nursing note reviewed.  Constitutional:      General: He is in acute distress.     Comments: Patient is a 49 year old pleasant male; appears to be in acute discomfort.  HENT:     Head: Normocephalic and atraumatic.     Nose: Nose normal.     Mouth/Throat:     Mouth: Mucous membranes are dry.  Eyes:     General: No scleral icterus. Cardiovascular:     Rate and Rhythm: Normal rate and regular rhythm.     Pulses: Normal pulses.     Heart sounds: Normal heart sounds.  Pulmonary:     Effort: Pulmonary effort is normal. No respiratory distress.     Breath sounds: No wheezing.  Abdominal:     Palpations: Abdomen is soft.     Tenderness: There is abdominal tenderness (Right lower quadrant and right flank tenderness palpation.  No CVA tenderness). There is no right CVA tenderness or left CVA tenderness.     Comments: Tenderness palpation of right flank/right lower quadrant  Negative Murphy sign, no right upper quadrant tenderness.  Musculoskeletal:     Cervical back: Normal range of motion.     Right lower leg: No edema.     Left lower leg: No edema.  Skin:    General: Skin is warm and dry.     Capillary Refill: Capillary refill takes less than 2 seconds.     Comments: Well-healed surgical scar to abdomen from past nephrectomy  Neurological:     Mental Status: He is alert. Mental status is at baseline.  Psychiatric:        Mood and Affect: Mood normal.        Behavior: Behavior normal.     ED Results / Procedures / Treatments   Labs (all labs ordered are listed, but only abnormal results are displayed) Labs Reviewed  CBC WITH DIFFERENTIAL/PLATELET - Abnormal; Notable for the following components:      Result Value   WBC 17.9 (*)    Neutro Abs 16.5 (*)    Lymphs Abs 0.5 (*)    Abs  Immature Granulocytes 0.21 (*)    All other components within normal limits  COMPREHENSIVE METABOLIC PANEL - Abnormal; Notable for the following components:   Potassium 5.6 (*)    Glucose, Bld 182 (*)    BUN 43 (*)    Creatinine, Ser 5.28 (*)    Total Bilirubin 2.1 (*)    GFR calc non Af Amer 12 (*)    GFR calc Af Amer 13 (*)    All other components within normal limits  SARS CORONAVIRUS 2 (TAT 6-24 HRS)  RESPIRATORY PANEL BY RT  PCR (FLU A&B, COVID)  LIPASE, BLOOD  CK  URINALYSIS, ROUTINE W REFLEX MICROSCOPIC    EKG None  Radiology CT ABDOMEN PELVIS WO CONTRAST  Result Date: 05/04/2019 CLINICAL DATA:  Right flank pain EXAM: CT ABDOMEN AND PELVIS WITHOUT CONTRAST TECHNIQUE: Multidetector CT imaging of the abdomen and pelvis was performed following the standard protocol without IV contrast. COMPARISON:  None. FINDINGS: LOWER CHEST: Normal. HEPATOBILIARY: Normal hepatic contours. No intra- or extrahepatic biliary dilatation. The gallbladder is normal. PANCREAS: Normal pancreas. No ductal dilatation or peripancreatic fluid collection. SPLEEN: Normal. ADRENALS/URINARY TRACT: The adrenal glands are normal. There is a stone within the distal right ureter measuring 4 mm, causing mild hydronephrosis and moderate hydroureter and mild perinephric stranding. Left kidney is surgically absent. No abnormality of the left renal fossa. The urinary bladder is normal for degree of distention STOMACH/BOWEL: There is no hiatal hernia. Normal duodenal course and caliber. No small bowel dilatation or inflammation. No focal colonic abnormality. Normal appendix. VASCULAR/LYMPHATIC: Normal course and caliber of the major abdominal vessels. No abdominal or pelvic lymphadenopathy. REPRODUCTIVE: Normal prostate size with symmetric seminal vesicles. MUSCULOSKELETAL. No bony spinal canal stenosis or focal osseous abnormality. OTHER: None. IMPRESSION: 1. Right obstructive uropathy with 4 mm stone in the distal right ureter  causing mild hydronephrosis and moderate hydroureter. 2. Status post left nephrectomy. Electronically Signed   By: Ulyses Jarred M.D.   On: 05/04/2019 20:05    Procedures .Critical Care Performed by: Tedd Sias, PA Authorized by: Tedd Sias, PA   Critical care provider statement:    Critical care time (minutes):  35   Critical care time was exclusive of:  Separately billable procedures and treating other patients and teaching time   Critical care was necessary to treat or prevent imminent or life-threatening deterioration of the following conditions:  Renal failure   Critical care was time spent personally by me on the following activities:  Discussions with consultants, evaluation of patient's response to treatment, examination of patient, review of old charts, re-evaluation of patient's condition, pulse oximetry, ordering and review of radiographic studies, ordering and review of laboratory studies and ordering and performing treatments and interventions   I assumed direction of critical care for this patient from another provider in my specialty: no     (including critical care time)  Medications Ordered in ED Medications  cefTRIAXone (ROCEPHIN) 1 g in sodium chloride 0.9 % 100 mL IVPB (1 g Intravenous New Bag/Given 05/04/19 2040)  0.9 %  sodium chloride infusion ( Intravenous Stopped 05/04/19 2033)  morphine 4 MG/ML injection 4 mg (4 mg Intravenous Given 05/04/19 1848)  ondansetron (ZOFRAN) injection 4 mg (4 mg Intravenous Given 05/04/19 1848)  0.9 %  sodium chloride infusion ( Intravenous New Bag/Given 05/04/19 2040)  tamsulosin (FLOMAX) capsule 0.4 mg (0.4 mg Oral Given 05/04/19 2043)    ED Course  I have reviewed the triage vital signs and the nursing notes.  Pertinent labs & imaging results that were available during my care of the patient were reviewed by me and considered in my medical decision making (see chart for details).  Patient is 49 year old male with single  right kidney presented today with right lower quadrant and right flank pain since yesterday.  He states that he went to PCP today who advised to go to ED for further evaluation.  He is endorsing chills and has vomited twice which is nonbloody nonbilious.  Patient states that he had his left kidney removed approximately 2 months ago  because of a cancerous mass.  I reviewed MRI and found evidence that this was a transitional cell carcinoma.  This is taken out by surgeon Burman Nieves.   Creatinine is dramatically increased to 5.28 with BUN elevation to 43.  Patient's baseline creatinine is approximately 1.  Patient does have leukocytosis of 18 will begin patient on Rocephin empirically.  Urine not obtained at this time. Bilirubin mildly elevated at 2.1 with no RUQ pain or murphy sign. No RUQ TTP. Doubt biliary disease. Pt has not eaten today likely bilirubin elevation 2/2 decreased PO.   CT shows 1. Right obstructive uropathy with 4 mm stone in the distal right  ureter causing mild hydronephrosis and moderate hydroureter.  2. Status post left nephrectomy.      Clinical Course as of May 03 2053  Tue May 04, 2019  2033 Discussed with Dr. Gloriann Loan.  He recommends n.p.o. and rapid Covid test.   [WF]  2039 Patient with leukocytosis with neutrophil predominance.  CMP remarkable for mild elevation potassium 5.6.   [WF]    Clinical Course User Index [WF] Tedd Sias, [place]   MDM Rules/Calculators/A&P                      Discussed with Dr. Jonelle Sidle of internal medicine.  He will admit patient to hospital. Per Dr. Gloriann Loan: Patient will likely be going to surgery with Dr. Hazle Nordmann.  Final Clinical Impression(s) / ED Diagnoses Final diagnoses:  Right kidney stone  Ureterolithiasis    Rx / DC Orders ED Discharge Orders    None       Tedd Sias, [place] 05/04/19 2055    Lacretia Leigh, MD 05/07/19 1340

## 2019-05-04 NOTE — Anesthesia Procedure Notes (Signed)
Date/Time: 05/04/2019 10:56 PM Performed by: Cynda Familia, CRNA Oxygen Delivery Method: Simple face mask Placement Confirmation: positive ETCO2 and breath sounds checked- equal and bilateral Dental Injury: Teeth and Oropharynx as per pre-operative assessment

## 2019-05-04 NOTE — ED Triage Notes (Signed)
Pt c/o right flank pains since yesterday. Went to PCP today and was advised to go to Ed for further evaluation. Had left kidney remove last year due to cancer.

## 2019-05-04 NOTE — Anesthesia Preprocedure Evaluation (Addendum)
Anesthesia Evaluation  Patient identified by MRN, date of birth, ID band Patient awake    Reviewed: Allergy & Precautions, H&P , NPO status , Patient's Chart, lab work & pertinent test results  Airway Mallampati: II  TM Distance: >3 FB Neck ROM: Full    Dental no notable dental hx. (+) Teeth Intact, Dental Advisory Given   Pulmonary neg pulmonary ROS,    Pulmonary exam normal breath sounds clear to auscultation       Cardiovascular hypertension, Pt. on medications  Rhythm:Regular Rate:Normal     Neuro/Psych negative neurological ROS  negative psych ROS   GI/Hepatic Neg liver ROS, GERD  Controlled,  Endo/Other  diabetes, Type 2, Oral Hypoglycemic Agents  Renal/GU Renal disease  negative genitourinary   Musculoskeletal   Abdominal   Peds  Hematology negative hematology ROS (+)   Anesthesia Other Findings   Reproductive/Obstetrics negative OB ROS                            Anesthesia Physical Anesthesia Plan  ASA: II  Anesthesia Plan: General   Post-op Pain Management:    Induction: Intravenous  PONV Risk Score and Plan: 3 and Ondansetron, Dexamethasone and Midazolam  Airway Management Planned: LMA  Additional Equipment:   Intra-op Plan:   Post-operative Plan: Extubation in OR  Informed Consent: I have reviewed the patients History and Physical, chart, labs and discussed the procedure including the risks, benefits and alternatives for the proposed anesthesia with the patient or authorized representative who has indicated his/her understanding and acceptance.     Dental advisory given  Plan Discussed with: CRNA  Anesthesia Plan Comments:        Anesthesia Quick Evaluation

## 2019-05-05 ENCOUNTER — Inpatient Hospital Stay (HOSPITAL_COMMUNITY): Payer: Commercial Managed Care - PPO

## 2019-05-05 DIAGNOSIS — N10 Acute tubulo-interstitial nephritis: Secondary | ICD-10-CM | POA: Diagnosis present

## 2019-05-05 DIAGNOSIS — R6510 Systemic inflammatory response syndrome (SIRS) of non-infectious origin without acute organ dysfunction: Secondary | ICD-10-CM

## 2019-05-05 DIAGNOSIS — E785 Hyperlipidemia, unspecified: Secondary | ICD-10-CM | POA: Diagnosis present

## 2019-05-05 LAB — COMPREHENSIVE METABOLIC PANEL
ALT: 21 U/L (ref 0–44)
AST: 22 U/L (ref 15–41)
Albumin: 3.1 g/dL — ABNORMAL LOW (ref 3.5–5.0)
Alkaline Phosphatase: 42 U/L (ref 38–126)
Anion gap: 10 (ref 5–15)
BUN: 45 mg/dL — ABNORMAL HIGH (ref 6–20)
CO2: 20 mmol/L — ABNORMAL LOW (ref 22–32)
Calcium: 7.7 mg/dL — ABNORMAL LOW (ref 8.9–10.3)
Chloride: 107 mmol/L (ref 98–111)
Creatinine, Ser: 5.07 mg/dL — ABNORMAL HIGH (ref 0.61–1.24)
GFR calc Af Amer: 14 mL/min — ABNORMAL LOW (ref >60)
GFR calc non Af Amer: 12 mL/min — ABNORMAL LOW (ref >60)
Glucose, Bld: 144 mg/dL — ABNORMAL HIGH (ref 70–99)
Potassium: 5.1 mmol/L (ref 3.5–5.1)
Sodium: 137 mmol/L (ref 135–145)
Total Bilirubin: 1.4 mg/dL — ABNORMAL HIGH (ref 0.3–1.2)
Total Protein: 5.5 g/dL — ABNORMAL LOW (ref 6.5–8.1)

## 2019-05-05 LAB — CBC
HCT: 35 % — ABNORMAL LOW (ref 39.0–52.0)
Hemoglobin: 11.9 g/dL — ABNORMAL LOW (ref 13.0–17.0)
MCH: 31.5 pg (ref 26.0–34.0)
MCHC: 34 g/dL (ref 30.0–36.0)
MCV: 92.6 fL (ref 80.0–100.0)
Platelets: 116 10*3/uL — ABNORMAL LOW (ref 150–400)
RBC: 3.78 MIL/uL — ABNORMAL LOW (ref 4.22–5.81)
RDW: 12.4 % (ref 11.5–15.5)
WBC: 9.4 10*3/uL (ref 4.0–10.5)
nRBC: 0 % (ref 0.0–0.2)

## 2019-05-05 LAB — HIV ANTIBODY (ROUTINE TESTING W REFLEX): HIV Screen 4th Generation wRfx: NONREACTIVE

## 2019-05-05 MED ORDER — DOCUSATE SODIUM 283 MG RE ENEM
1.0000 | ENEMA | RECTAL | Status: DC | PRN
  Filled 2019-05-05: qty 1

## 2019-05-05 MED ORDER — OXYCODONE HCL 5 MG PO TABS
5.0000 mg | ORAL_TABLET | Freq: Once | ORAL | Status: AC
  Administered 2019-05-05: 5 mg via ORAL
  Filled 2019-05-05: qty 1

## 2019-05-05 MED ORDER — HEPARIN SODIUM (PORCINE) 5000 UNIT/ML IJ SOLN
5000.0000 [IU] | Freq: Three times a day (TID) | INTRAMUSCULAR | Status: DC
  Administered 2019-05-05 – 2019-05-10 (×15): 5000 [IU] via SUBCUTANEOUS
  Filled 2019-05-05 (×16): qty 1

## 2019-05-05 MED ORDER — ACETAMINOPHEN 650 MG RE SUPP: 650.0000 mg | Freq: Four times a day (QID) | RECTAL | Status: DC | PRN

## 2019-05-05 MED ORDER — POLYETHYLENE GLYCOL 3350 17 G PO PACK
17.0000 g | PACK | Freq: Every day | ORAL | Status: DC
  Administered 2019-05-05 – 2019-05-08 (×4): 17 g via ORAL
  Filled 2019-05-05 (×6): qty 1

## 2019-05-05 MED ORDER — ACETAMINOPHEN 325 MG PO TABS
650.0000 mg | ORAL_TABLET | Freq: Four times a day (QID) | ORAL | Status: DC | PRN
  Administered 2019-05-05 – 2019-05-08 (×5): 650 mg via ORAL
  Filled 2019-05-05 (×4): qty 2

## 2019-05-05 MED ORDER — SORBITOL 70 % SOLN: 30.0000 mL | Status: DC | PRN

## 2019-05-05 MED ORDER — ONDANSETRON HCL 4 MG/2ML IJ SOLN
4.0000 mg | Freq: Four times a day (QID) | INTRAMUSCULAR | Status: DC | PRN
  Administered 2019-05-07: 4 mg via INTRAVENOUS
  Filled 2019-05-05: qty 2

## 2019-05-05 MED ORDER — ATORVASTATIN CALCIUM 10 MG PO TABS
20.0000 mg | ORAL_TABLET | ORAL | Status: DC
  Administered 2019-05-06 – 2019-05-11 (×6): 20 mg via ORAL
  Filled 2019-05-05 (×2): qty 2
  Filled 2019-05-05 (×2): qty 1
  Filled 2019-05-05: qty 2
  Filled 2019-05-05: qty 1
  Filled 2019-05-05: qty 2

## 2019-05-05 MED ORDER — NEPRO/CARBSTEADY PO LIQD
237.0000 mL | Freq: Three times a day (TID) | ORAL | Status: DC | PRN
  Filled 2019-05-05: qty 237

## 2019-05-05 MED ORDER — CAMPHOR-MENTHOL 0.5-0.5 % EX LOTN
1.0000 "application " | TOPICAL_LOTION | Freq: Three times a day (TID) | CUTANEOUS | Status: DC | PRN
  Filled 2019-05-05: qty 222

## 2019-05-05 MED ORDER — HYDROXYZINE HCL 25 MG PO TABS: 25.0000 mg | ORAL_TABLET | Freq: Three times a day (TID) | ORAL | Status: DC | PRN

## 2019-05-05 MED ORDER — SENNA 8.6 MG PO TABS
1.0000 | ORAL_TABLET | Freq: Every day | ORAL | Status: DC | PRN
  Administered 2019-05-05: 8.6 mg via ORAL
  Filled 2019-05-05: qty 1

## 2019-05-05 MED ORDER — CALCIUM CARBONATE ANTACID 1250 MG/5ML PO SUSP
500.0000 mg | Freq: Four times a day (QID) | ORAL | Status: DC | PRN
  Filled 2019-05-05: qty 5

## 2019-05-05 MED ORDER — ZOLPIDEM TARTRATE 5 MG PO TABS
5.0000 mg | ORAL_TABLET | Freq: Every evening | ORAL | Status: DC | PRN
  Administered 2019-05-07: 5 mg via ORAL
  Filled 2019-05-05: qty 1

## 2019-05-05 MED ORDER — CHLORHEXIDINE GLUCONATE CLOTH 2 % EX PADS
6.0000 | MEDICATED_PAD | Freq: Every morning | CUTANEOUS | Status: DC
  Administered 2019-05-05 – 2019-05-07 (×3): 6 via TOPICAL

## 2019-05-05 MED ORDER — ONDANSETRON HCL 4 MG PO TABS: 4.0000 mg | ORAL_TABLET | Freq: Four times a day (QID) | ORAL | Status: DC | PRN

## 2019-05-05 NOTE — Progress Notes (Signed)
75 cc of red urine obtained from Foley catheter this morning. Bladder scan and 0 urine obtained.  Prior to admission at  midnight, transferring nurse stated that foley was emptied before transfer.  Patient remains in no acute distress.

## 2019-05-05 NOTE — Progress Notes (Signed)
Bladder scan completed due to decreased urine output overnight. Bladder scan 0 cc. Pt denies feeling of fullness and or discomfort.

## 2019-05-05 NOTE — Progress Notes (Signed)
PROGRESS NOTE    Jorge Welch    Code Status: Full Code  TGY:[id number] DOB: 12/18/1970 DOA: 05/04/2019 LOS: 1 days  PCP: Patient, No Pcp Per CC:  Chief Complaint  Patient presents with  . 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  Urology was consulted and patient was taken to the OR on 3/16 evening for right cystoscopy with right ureteral stent placement and urethral dilation.  A & P   Principal Problem:   Acute renal failure (ARF) (HCC) Active Problems:   Diabetes ([place])   Essential hypertension   Hyperlipidemia   Acute pyelonephritis   1. AKI secondary to obstructive uropathy, s/p cystoscopy with right ureteral stent placement and urethral dilation (05/04/19, Dr. Gloriann Loan) a. Slightly improved post procedure, creatinine 5.28-> 5.07, baseline 1.3 b. Poor urine output today c. Holding home lisinopril/HCTZ d. Follow-up abdominal ultrasound ordered by urology 2. SIRS / suspected acute pyelonephritis a. Purulent urine noted at procedure b. Tachycardic, tachypneic, T 99.24F c. Continue Rocephin, day 2/TBD 3. Recurrent nephrolithiasis a. Plan per urology 4. Hypertension a. Stable off meds 5. Hyperlipidemia a. Resume home Lipitor 6. Diabetes a. Continue sliding scale  DVT prophylaxis: Lovenox Family Communication: Called wife without response Disposition Plan:   Patient came from:   Home                                                                                          Anticipated d/c place: Home  Barriers to d/c: Pending clinical improvement and improved renal function, hopeful discharge in 48 to 72 hours  Pressure injury documentation      None  Consultants  Urology  Procedures  Cystoscopy and ureteral stent placement 3/16  Antibiotics   Anti-infectives (From admission, onward)   Start     Dose/Rate Route Frequency Ordered Stop   05/05/19 2200  cefTRIAXone (ROCEPHIN) 2 g in sodium chloride 0.9 % 100 mL IVPB     2 g 200 mL/hr over 30 Minutes Intravenous Every 24 hours 05/04/19 2359     05/05/19 0230  cefTRIAXone (ROCEPHIN) 1 g in sodium chloride 0.9 % 100 mL IVPB     1 g 200 mL/hr over 30 Minutes Intravenous  Once 05/04/19 2331 05/05/19 0311   05/04/19 2030  cefTRIAXone (ROCEPHIN) 1 g in sodium chloride 0.9 % 100 mL IVPB     1 g 200 mL/hr over 30 Minutes Intravenous  Once 05/04/19 2018 05/04/19 2110        Subjective   Admits to some lower abdominal discomfort but otherwise denies any complaints.  Per nursing patient has had decreased urine output overnight despite IV fluids.  Objective   Vitals:   05/05/19 0609 05/05/19 0959 05/05/19 1414 05/05/19 1730  BP:  104/87 129/90 113/80  Pulse:  91 94 (!) 105  Resp:  18 18 (!)  22  Temp:  98.5 F (36.9 C) 98.2 F (36.8 C) 99.6 F (37.6 C)  TempSrc:  Oral Oral Oral  SpO2:  96% 95% 93%  Weight: 81.6 kg     Height:        Intake/Output Summary (Last 24 hours) at 05/05/2019 1750 Last data filed at 05/05/2019 1603 Gross per 24 hour  Intake 2202.45 ml  Output 280 ml  Net 1922.45 ml   Filed Weights   05/05/19 0109 05/05/19 0609  Weight: 83.7 kg 81.6 kg    Examination:  Physical Exam Vitals and nursing note reviewed.  Constitutional:      Appearance: He is not ill-appearing.  HENT:     Head: Normocephalic.  Eyes:     Conjunctiva/sclera: Conjunctivae normal.  Pulmonary:     Effort: Pulmonary effort is normal. No respiratory distress.  Abdominal:     General: Abdomen is flat.     Comments: Lower abdominal discomfort to palpation  Genitourinary:    Comments: Foley with red-tinged urine Musculoskeletal:        General: No swelling.  Skin:     Coloration: Skin is not jaundiced.  Neurological:     Mental Status: He is alert.  Psychiatric:        Mood and Affect: Mood normal.     Data Reviewed: I have personally reviewed following labs and imaging studies  CBC: Recent Labs  Lab 05/04/19 1827 05/05/19 0127  WBC 17.9* 9.4  NEUTROABS 16.5*  --   HGB 14.7 11.9*  HCT 41.8 35.0*  MCV 91.7 92.6  PLT 160 924*   Basic Metabolic Panel: Recent Labs  Lab 05/04/19 1826 05/05/19 0127  NA 139 137  K 5.6* 5.1  CL 102 107  CO2 26 20*  GLUCOSE 182* 144*  BUN 43* 45*  CREATININE 5.28* 5.07*  CALCIUM 9.7 7.7*   GFR: Estimated Creatinine Clearance: 17.3 mL/min (A) (by C-G formula based on SCr of 5.07 mg/dL (H)). Liver Function Tests: Recent Labs  Lab 05/04/19 1826 05/05/19 0127  AST 24 22  ALT 23 21  ALKPHOS 61 42  BILITOT 2.1* 1.4*  PROT 7.3 5.5*  ALBUMIN 4.2 3.1*   Recent Labs  Lab 05/04/19 1826  LIPASE 23   No results for input(s): AMMONIA in the last 168 hours. Coagulation Profile: No results for input(s): INR, PROTIME in the last 168 hours. Cardiac Enzymes: Recent Labs  Lab 05/04/19 1826  CKTOTAL 107   BNP (last 3 results) No results for input(s): PROBNP in the last 8760 hours. HbA1C: No results for input(s): HGBA1C in the last 72 hours. CBG: Recent Labs  Lab 05/04/19 2308  GLUCAP 119*   Lipid Profile: No results for input(s): CHOL, HDL, LDLCALC, TRIG, CHOLHDL, LDLDIRECT in the last 72 hours. Thyroid Function Tests: No results for input(s): TSH, T4TOTAL, FREET4, T3FREE, THYROIDAB in the last 72 hours. Anemia Panel: No results for input(s): VITAMINB12, FOLATE, FERRITIN, TIBC, IRON, RETICCTPCT in the last 72 hours. Sepsis Labs: No results for input(s): PROCALCITON, LATICACIDVEN in the last 168 hours.  Recent Results (from the past 240 hour(s))  Respiratory Panel by RT PCR (Flu A&B, Covid) - Nasopharyngeal Swab     Status: None   Collection Time: 05/04/19  8:33 PM   Specimen: Nasopharyngeal  Swab  Result Value Ref Range Status   SARS Coronavirus 2 by RT PCR NEGATIVE NEGATIVE Final    Comment: (NOTE) SARS-CoV-2 target nucleic acids are NOT DETECTED. The SARS-CoV-2 RNA is generally detectable in upper respiratoy  specimens during the acute phase of infection. The lowest concentration of SARS-CoV-2 viral copies this assay can detect is 131 copies/mL. A negative result does not preclude SARS-Cov-2 infection and should not be used as the sole basis for treatment or other patient management decisions. A negative result may occur with  improper specimen collection/handling, submission of specimen other than nasopharyngeal swab, presence of viral mutation(s) within the areas targeted by this assay, and inadequate number of viral copies (<131 copies/mL). A negative result must be combined with clinical observations, patient history, and epidemiological information. The expected result is Negative. Fact Sheet for Patients:  PinkCheek.be Fact Sheet for Healthcare Providers:  GravelBags.it This test is not yet ap proved or cleared by the [place] FDA and  has been authorized for detection and/or diagnosis of SARS-CoV-2 by FDA under an Emergency Use Authorization (EUA). This EUA will remain  in effect (meaning this test can be used) for the duration of the COVID-19 declaration under Section 564(b)(1) of the Act, 21 U.S.C. section 360bbb-3(b)(1), unless the authorization is terminated or revoked sooner.    Influenza A by PCR NEGATIVE NEGATIVE Final   Influenza B by PCR NEGATIVE NEGATIVE Final    Comment: (NOTE) The Xpert Xpress SARS-CoV-2/FLU/RSV assay is intended as an aid in  the diagnosis of influenza from Nasopharyngeal swab specimens and  should not be used as a sole basis for treatment. Nasal washings and  aspirates are unacceptable for Xpert Xpress SARS-CoV-2/FLU/RSV  testing. Fact Sheet for  Patients: PinkCheek.be Fact Sheet for Healthcare Providers: GravelBags.it This test is not yet approved or cleared by the [place] FDA and  has been authorized for detection and/or diagnosis of SARS-CoV-2 by  FDA under an Emergency Use Authorization (EUA). This EUA will remain  in effect (meaning this test can be used) for the duration of the  Covid-19 declaration under Section 564(b)(1) of the Act, 21  U.S.C. section 360bbb-3(b)(1), unless the authorization is  terminated or revoked. Performed at [facility], [place] [address] [place], [place] [address]          Radiology Studies: CT ABDOMEN PELVIS WO CONTRAST  Result Date: 05/04/2019 CLINICAL DATA:  Right flank pain EXAM: CT ABDOMEN AND PELVIS WITHOUT CONTRAST TECHNIQUE: Multidetector CT imaging of the abdomen and pelvis was performed following the standard protocol without IV contrast. COMPARISON:  None. FINDINGS: LOWER CHEST: Normal. HEPATOBILIARY: Normal hepatic contours. No intra- or extrahepatic biliary dilatation. The gallbladder is normal. PANCREAS: Normal pancreas. No ductal dilatation or peripancreatic fluid collection. SPLEEN: Normal. ADRENALS/URINARY TRACT: The adrenal glands are normal. There is a stone within the distal right ureter measuring 4 mm, causing mild hydronephrosis and moderate hydroureter and mild perinephric stranding. Left kidney is surgically absent. No abnormality of the left renal fossa. The urinary bladder is normal for degree of distention STOMACH/BOWEL: There is no hiatal hernia. Normal duodenal course and caliber. No small bowel dilatation or inflammation. No focal colonic abnormality. Normal appendix. VASCULAR/LYMPHATIC: Normal course and caliber of the major abdominal vessels. No abdominal or pelvic lymphadenopathy. REPRODUCTIVE: Normal prostate size with symmetric seminal vesicles. MUSCULOSKELETAL. No bony spinal canal  stenosis or focal osseous abnormality. OTHER: None. IMPRESSION: 1. Right obstructive uropathy with 4 mm stone in the distal right ureter causing mild hydronephrosis and moderate hydroureter. 2. Status post left nephrectomy. Electronically Signed   By: Ulyses Jarred M.D.   On: 05/04/2019 20:05   DG C-Arm 1-60 Min-No Report  Result Date: 05/04/2019 Fluoroscopy was utilized by the requesting  physician.  No radiographic interpretation.        Scheduled Meds: . [START ON 05/06/2019] atorvastatin  20 mg Oral [id number]  . Chlorhexidine Gluconate Cloth  6 each Topical q morning - 10a  . heparin  5,000 Units Subcutaneous Q8H  . oxyCODONE  5 mg Oral Once   Continuous Infusions: . sodium chloride 125 mL/hr at 05/05/19 1420  . cefTRIAXone (ROCEPHIN)  IV       Time spent: 28 minutes with over 50% of the time coordinating the patient's care    Harold Hedge, DO [facility] Pager [phone]  Call night coverage person covering after 7pm

## 2019-05-05 NOTE — Progress Notes (Addendum)
Discussed decreased urinary output with Hospitalist provider and Urologist. Bladder scan completed due to decreased urine output 200 cc's eight hour shift. Bladder scan 0 cc. Pt denies feeling of fullness and or discomfort. Pt complains of worsening headache 7/10. V.O received Urologist, to administer oxycodone 5 mg po once. Will continue to monitor.

## 2019-05-06 ENCOUNTER — Inpatient Hospital Stay (HOSPITAL_COMMUNITY): Payer: Commercial Managed Care - PPO

## 2019-05-06 DIAGNOSIS — I4891 Unspecified atrial fibrillation: Secondary | ICD-10-CM

## 2019-05-06 DIAGNOSIS — N179 Acute kidney failure, unspecified: Secondary | ICD-10-CM

## 2019-05-06 DIAGNOSIS — R9431 Abnormal electrocardiogram [ECG] [EKG]: Secondary | ICD-10-CM

## 2019-05-06 DIAGNOSIS — R6520 Severe sepsis without septic shock: Secondary | ICD-10-CM

## 2019-05-06 DIAGNOSIS — A419 Sepsis, unspecified organism: Principal | ICD-10-CM

## 2019-05-06 LAB — URINALYSIS, ROUTINE W REFLEX MICROSCOPIC
Bilirubin Urine: NEGATIVE
Glucose, UA: 50 mg/dL — AB
Ketones, ur: 5 mg/dL — AB
Nitrite: NEGATIVE
Protein, ur: >=300 mg/dL — AB
RBC / HPF: >50 RBC/hpf — ABNORMAL HIGH (ref 0–5)
Specific Gravity, Urine: 1.016 (ref 1.005–1.030)
pH: 6 (ref 5.0–8.0)

## 2019-05-06 LAB — CBC
HCT: 34.4 % — ABNORMAL LOW (ref 39.0–52.0)
Hemoglobin: 11.6 g/dL — ABNORMAL LOW (ref 13.0–17.0)
MCH: 31.7 pg (ref 26.0–34.0)
MCHC: 33.7 g/dL (ref 30.0–36.0)
MCV: 94 fL (ref 80.0–100.0)
Platelets: 113 10*3/uL — ABNORMAL LOW (ref 150–400)
RBC: 3.66 MIL/uL — ABNORMAL LOW (ref 4.22–5.81)
RDW: 12.8 % (ref 11.5–15.5)
WBC: 11.4 10*3/uL — ABNORMAL HIGH (ref 4.0–10.5)
nRBC: 0 % (ref 0.0–0.2)

## 2019-05-06 LAB — BASIC METABOLIC PANEL
Anion gap: 12 (ref 5–15)
BUN: 77 mg/dL — ABNORMAL HIGH (ref 6–20)
CO2: 18 mmol/L — ABNORMAL LOW (ref 22–32)
Calcium: 7.7 mg/dL — ABNORMAL LOW (ref 8.9–10.3)
Chloride: 106 mmol/L (ref 98–111)
Creatinine, Ser: 7.85 mg/dL — ABNORMAL HIGH (ref 0.61–1.24)
GFR calc Af Amer: 8 mL/min — ABNORMAL LOW (ref >60)
GFR calc non Af Amer: 7 mL/min — ABNORMAL LOW (ref >60)
Glucose, Bld: 118 mg/dL — ABNORMAL HIGH (ref 70–99)
Potassium: 4.8 mmol/L (ref 3.5–5.1)
Sodium: 136 mmol/L (ref 135–145)

## 2019-05-06 LAB — ECHOCARDIOGRAM COMPLETE
Height: 66 in
Weight: 2876.8 oz

## 2019-05-06 LAB — MAGNESIUM: Magnesium: 1.3 mg/dL — ABNORMAL LOW (ref 1.7–2.4)

## 2019-05-06 LAB — TSH: TSH: 2.812 u[IU]/mL (ref 0.350–4.500)

## 2019-05-06 MED ORDER — METOPROLOL TARTRATE 5 MG/5ML IV SOLN
5.0000 mg | Freq: Once | INTRAVENOUS | Status: DC
  Filled 2019-05-06 (×2): qty 5

## 2019-05-06 MED ORDER — DILTIAZEM LOAD VIA INFUSION
20.0000 mg | Freq: Once | INTRAVENOUS | Status: AC
  Administered 2019-05-06: 20 mg via INTRAVENOUS
  Filled 2019-05-06: qty 20

## 2019-05-06 MED ORDER — METOPROLOL TARTRATE 25 MG PO TABS: 25.0000 mg | ORAL_TABLET | Freq: Two times a day (BID) | ORAL | Status: DC

## 2019-05-06 MED ORDER — HYDROCODONE-ACETAMINOPHEN 5-325 MG PO TABS
1.0000 | ORAL_TABLET | ORAL | Status: DC | PRN
  Administered 2019-05-06 (×2): 1 via ORAL
  Filled 2019-05-06 (×2): qty 1

## 2019-05-06 MED ORDER — MAGNESIUM SULFATE 4 GM/100ML IV SOLN
4.0000 g | Freq: Once | INTRAVENOUS | Status: AC
  Administered 2019-05-06: 4 g via INTRAVENOUS
  Filled 2019-05-06: qty 100

## 2019-05-06 MED ORDER — METOPROLOL TARTRATE 5 MG/5ML IV SOLN: 2.5000 mg | INTRAVENOUS | Status: DC

## 2019-05-06 MED ORDER — DILTIAZEM HCL-DEXTROSE 125-5 MG/125ML-% IV SOLN (PREMIX)
5.0000 mg/h | INTRAVENOUS | Status: DC
  Administered 2019-05-06: 5 mg/h via INTRAVENOUS
  Administered 2019-05-07: 7.5 mg/h via INTRAVENOUS
  Filled 2019-05-06 (×2): qty 125

## 2019-05-06 MED ORDER — DOXYCYCLINE HYCLATE 100 MG PO TABS
100.0000 mg | ORAL_TABLET | Freq: Two times a day (BID) | ORAL | Status: AC
  Administered 2019-05-06 – 2019-05-10 (×9): 100 mg via ORAL
  Filled 2019-05-06 (×9): qty 1

## 2019-05-06 NOTE — Progress Notes (Signed)
PROGRESS NOTE    Jorge Welch    Code Status: Full Code  TFT:[id number] DOB: 04/29/1970 DOA: 05/04/2019 LOS: 2 days  PCP: Patient, No Pcp Per CC:  Chief Complaint  Patient presents with  . 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  Urology was consulted and patient was taken to the OR on 3/16 evening for right cystoscopy with right ureteral stent placement and urethral dilation.  3/18: A. fib with RVR started on Cardizem drip.  Renal function worsened with creatinine up to 7.  Nephrology consulted.  Hypoxic, chest x-ray showing concern for pneumonia  A & P   Principal Problem:   Acute renal failure (ARF) (HCC) Active Problems:   Diabetes ([facility])   Essential hypertension   Hyperlipidemia   Acute pyelonephritis   1. AKI secondary to obstructive uropathy, s/p cystoscopy with right ureteral stent placement and urethral dilation (05/04/19, Dr. Gloriann Loan) a. Slightly improved post procedure, creatinine 7.85, baseline 1.3 b. Still poor urine output today c. Holding home lisinopril/HCTZ d. Renal ultrasound unremarkable e. UA with UTI, gross hematuria and proteinuria f. Nephrology consulted -team supportive care, may need temporary dialysis 2. Sepsis secondary to E. coli UTI, concern for developing pneumonia a. Purulent urine noted at procedure b. Low-grade fever 100.23F, A. fib with RVR up to 160, tachypneic, leukocytosis c. Continue Rocephin, day 3/TBD and follow-up sensitivities d. Add-on doxycycline for CAP coverage 3. Acute hypoxic respiratory failure, concern for developing CAP a. Day 3 Rocephin, start doxycycline for CAP coverage.  Hold off on azithromycin  given arrhythmia b. Hold IV fluids as personal read of CXR shows fluid in right fissure may be contributing to hypoxia 4. A. fib with RVR Likely secondary to sepsis a. CHA2DS2-VASc: At least 2 (hypertension, diabetes) however off heparin drip given recent urologic procedure and gross hematuria for now b. Improved on Cardizem bolus and drip-may need to transfer to stepdown unit if continues to need titration c. Echo 5. Recurrent nephrolithiasis a. Plan per urology 6. Hypertension a. Stable off meds 7. Hyperlipidemia a. Resume home Lipitor 8. Diabetes a. Continue sliding scale  DVT prophylaxis: Lovenox Family Communication: Discussed with wife at bedside yesterday.  Did not answer phone today Disposition Plan:   Patient came from:   Home                                                                                          Anticipated d/c place: Home  Barriers to d/c: Developing pneumonia, A. fib with RVR, worsening renal function.  Several days away from discharge  Pressure injury documentation    None  Consultants  Urology Nephrology  Procedures  Cystoscopy and ureteral stent placement 3/16  Antibiotics   Anti-infectives (From admission, onward)   Start     Dose/Rate Route Frequency Ordered Stop   05/05/19 2200  cefTRIAXone (ROCEPHIN) 2 g in sodium chloride 0.9 % 100 mL IVPB     2 g 200 mL/hr over 30 Minutes Intravenous Every 24 hours 05/04/19 2359     05/05/19 0230  cefTRIAXone (ROCEPHIN) 1 g in sodium chloride 0.9 % 100 mL IVPB     1 g 200 mL/hr over 30 Minutes Intravenous  Once 05/04/19 2331 05/05/19 0311   05/04/19 2030  cefTRIAXone (ROCEPHIN) 1 g in sodium chloride 0.9 % 100 mL IVPB     1 g 200 mL/hr over 30 Minutes Intravenous  Once 05/04/19 2018 05/04/19 2110        Subjective   Noted by nursing patient with increased heart rate to 160.  At bedside patient was asymptomatic with mild abdominal discomfort but otherwise no complaints.  He was started on  Cardizem bolus and drip and followed up on later in the morning.  Stated he had resolved abdominal discomfort and felt this may have been secondary to his heart rate.  Additionally patient was noted to have hypoxia requiring 2 to 3 L nasal cannula which is new from earlier in the morning.  CXR was obtained.   Otherwise denies complaints Objective   Vitals:   05/06/19 0603 05/06/19 0851 05/06/19 1049 05/06/19 1343  BP: 127/77 139/90 112/77 116/87  Pulse: (!) 104 (!) 107 70 (!) 110  Resp: 20 (!) 26    Temp: 98.4 F (36.9 C) 98.7 F (37.1 C)  99.2 F (37.3 C)  TempSrc: Oral Oral  Oral  SpO2: 97% 92% 90% 95%  Weight:      Height:        Intake/Output Summary (Last 24 hours) at 05/06/2019 1603 Last data filed at 05/06/2019 1400 Gross per 24 hour  Intake 4061.02 ml  Output 700 ml  Net 3361.02 ml   Filed Weights   05/05/19 0109 05/05/19 0609  Weight: 83.7 kg 81.6 kg    Examination:  Physical Exam Vitals and nursing note reviewed.  Constitutional:      Appearance: Normal appearance.  HENT:     Head: Normocephalic and atraumatic.     Comments: Flushed face Eyes:     Conjunctiva/sclera: Conjunctivae normal.  Cardiovascular:     Rate and Rhythm: Tachycardia present. Rhythm irregular.     Heart sounds: No murmur.  Pulmonary:     Effort: Pulmonary effort is normal.     Breath sounds: No wheezing.  Abdominal:     General: Abdomen is flat.     Palpations: Abdomen is soft.  Musculoskeletal:        General: No swelling or tenderness.  Skin:    Coloration: Skin is not jaundiced or pale.  Neurological:     Mental Status: He is alert. Mental status is at baseline.  Psychiatric:        Mood and Affect: Mood normal.        Behavior: Behavior normal.     Data Reviewed: I have personally reviewed following labs and imaging studies  CBC: Recent Labs  Lab 05/04/19 1827 05/05/19 0127 05/06/19 0441  WBC 17.9* 9.4 11.4*  NEUTROABS 16.5*  --   --   HGB 14.7 11.9* 11.6*    HCT 41.8 35.0* 34.4*  MCV 91.7 92.6 94.0  PLT 160 116* 093*   Basic Metabolic Panel: Recent Labs  Lab 05/04/19 1826 05/05/19 0127 05/06/19 0441  NA 139 137 136  K 5.6* 5.1 4.8  CL 102 107 106  CO2 26 20* 18*  GLUCOSE 182* 144*  118*  BUN 43* 45* 77*  CREATININE 5.28* 5.07* 7.85*  CALCIUM 9.7 7.7* 7.7*   GFR: Estimated Creatinine Clearance: 11.2 mL/min (A) (by C-G formula based on SCr of 7.85 mg/dL (H)). Liver Function Tests: Recent Labs  Lab 05/04/19 1826 05/05/19 0127  AST 24 22  ALT 23 21  ALKPHOS 61 42  BILITOT 2.1* 1.4*  PROT 7.3 5.5*  ALBUMIN 4.2 3.1*   Recent Labs  Lab 05/04/19 1826  LIPASE 23   No results for input(s): AMMONIA in the last 168 hours. Coagulation Profile: No results for input(s): INR, PROTIME in the last 168 hours. Cardiac Enzymes: Recent Labs  Lab 05/04/19 1826  CKTOTAL 107   BNP (last 3 results) No results for input(s): PROBNP in the last 8760 hours. HbA1C: No results for input(s): HGBA1C in the last 72 hours. CBG: Recent Labs  Lab 05/04/19 2308  GLUCAP 119*   Lipid Profile: No results for input(s): CHOL, HDL, LDLCALC, TRIG, CHOLHDL, LDLDIRECT in the last 72 hours. Thyroid Function Tests: Recent Labs    05/06/19 0441  TSH 2.812   Anemia Panel: No results for input(s): VITAMINB12, FOLATE, FERRITIN, TIBC, IRON, RETICCTPCT in the last 72 hours. Sepsis Labs: No results for input(s): PROCALCITON, LATICACIDVEN in the last 168 hours.  Recent Results (from the past 240 hour(s))  Respiratory Panel by RT PCR (Flu A&B, Covid) - Nasopharyngeal Swab     Status: None   Collection Time: 05/04/19  8:33 PM   Specimen: Nasopharyngeal Swab  Result Value Ref Range Status   SARS Coronavirus 2 by RT PCR NEGATIVE NEGATIVE Final    Comment: (NOTE) SARS-CoV-2 target nucleic acids are NOT DETECTED. The SARS-CoV-2 RNA is generally detectable in upper respiratoy specimens during the acute phase of infection. The lowest concentration of  SARS-CoV-2 viral copies this assay can detect is 131 copies/mL. A negative result does not preclude SARS-Cov-2 infection and should not be used as the sole basis for treatment or other patient management decisions. A negative result may occur with  improper specimen collection/handling, submission of specimen other than nasopharyngeal swab, presence of viral mutation(s) within the areas targeted by this assay, and inadequate number of viral copies (<131 copies/mL). A negative result must be combined with clinical observations, patient history, and epidemiological information. The expected result is Negative. Fact Sheet for Patients:  PinkCheek.be Fact Sheet for Healthcare Providers:  GravelBags.it This test is not yet ap proved or cleared by the [place] FDA and  has been authorized for detection and/or diagnosis of SARS-CoV-2 by FDA under an Emergency Use Authorization (EUA). This EUA will remain  in effect (meaning this test can be used) for the duration of the COVID-19 declaration under Section 564(b)(1) of the Act, 21 U.S.C. section 360bbb-3(b)(1), unless the authorization is terminated or revoked sooner.    Influenza A by PCR NEGATIVE NEGATIVE Final   Influenza B by PCR NEGATIVE NEGATIVE Final    Comment: (NOTE) The Xpert Xpress SARS-CoV-2/FLU/RSV assay is intended as an aid in  the diagnosis of influenza from Nasopharyngeal swab specimens and  should not be used as a sole basis for treatment. Nasal washings and  aspirates are unacceptable for Xpert Xpress SARS-CoV-2/FLU/RSV  testing. Fact Sheet for Patients: PinkCheek.be Fact Sheet for Healthcare Providers: GravelBags.it This test is not yet approved or cleared by the [place] FDA and  has been authorized for detection and/or diagnosis of SARS-CoV-2 by  FDA under an Emergency Use Authorization (EUA).  This EUA will remain  in  effect (meaning this test can be used) for the duration of the  Covid-19 declaration under Section 564(b)(1) of the Act, 21  U.S.C. section 360bbb-3(b)(1), unless the authorization is  terminated or revoked. Performed at [facility], [place] [address], [place], [place] [address]   Urine Culture     Status: Abnormal (Preliminary result)   Collection Time: 05/04/19 10:53 PM   Specimen: Urine, Cystoscope  Result Value Ref Range Status   Specimen Description   Final    URINE, CLEAN CATCH Performed at [facility], [place] [address], [place], [place] [address]    Special Requests   Final    CYSTOSCOPE Performed at [facility], [place] [address] [place], [place] [address]    Culture >=100,000 COLONIES/mL ESCHERICHIA COLI (A)  Final   Report Status PENDING  Incomplete  Culture, blood (routine x 2)     Status: None (Preliminary result)   Collection Time: 05/05/19  1:27 AM   Specimen: BLOOD  Result Value Ref Range Status   Specimen Description   Final    BLOOD RIGHT ANTECUBITAL Performed at [place] [address], [place], [place] [address]    Special Requests   Final    BOTTLES DRAWN AEROBIC AND ANAEROBIC Blood Culture adequate volume Performed at [facility] [address] [place], [place] [address]    Culture   Final    NO GROWTH 1 DAY Performed at [facility], [place] [address], [place], [place] [address]    Report Status PENDING  Incomplete  Culture, blood (routine x 2)     Status: None (Preliminary result)   Collection Time: 05/05/19  1:27 AM   Specimen: BLOOD RIGHT HAND  Result Value Ref Range Status   Specimen Description   Final    BLOOD RIGHT HAND Performed at [facility], [place] [address] [place], [place] [address]    Special Requests   Final    BOTTLES DRAWN AEROBIC ONLY Blood Culture adequate volume Performed at [place] [address] [place], [place] [address]    Culture   Final    NO GROWTH 1 DAY Performed at [facility], [place] [address], [place],  [address]    Report Status PENDING  Incomplete         Radiology Studies: CT ABDOMEN PELVIS WO CONTRAST  Result Date: 05/04/2019 CLINICAL DATA:  Right flank pain EXAM: CT ABDOMEN AND PELVIS WITHOUT CONTRAST TECHNIQUE: Multidetector CT imaging of the abdomen and pelvis was performed following the standard protocol without IV contrast. COMPARISON:  None. FINDINGS: LOWER CHEST: Normal. HEPATOBILIARY: Normal hepatic contours. No intra- or extrahepatic biliary dilatation. The gallbladder is normal. PANCREAS: Normal pancreas. No ductal dilatation or peripancreatic fluid collection. SPLEEN: Normal. ADRENALS/URINARY TRACT: The adrenal glands are normal. There is a stone within the distal right ureter measuring 4 mm, causing mild hydronephrosis and moderate hydroureter and mild perinephric stranding. Left kidney is surgically absent. No abnormality of the left renal fossa. The urinary bladder is normal for degree of distention STOMACH/BOWEL: There is no hiatal hernia. Normal duodenal course and caliber. No small bowel dilatation or inflammation. No focal colonic abnormality. Normal appendix. VASCULAR/LYMPHATIC: Normal course and caliber of the major abdominal vessels. No abdominal or pelvic lymphadenopathy. REPRODUCTIVE: Normal prostate size with symmetric seminal vesicles. MUSCULOSKELETAL. No bony spinal canal stenosis or focal osseous abnormality. OTHER: None. IMPRESSION: 1. Right obstructive uropathy with 4 mm stone  in the distal right ureter causing mild hydronephrosis and moderate hydroureter. 2. Status post left nephrectomy. Electronically Signed   By: Ulyses Jarred M.D.   On: 05/04/2019 20:05   DG Abd 1 View  Result Date: 05/05/2019 CLINICAL DATA:  Renal insufficiency right-sided stent EXAM: ABDOMEN - 1 VIEW COMPARISON:  CT 05/04/2019  FINDINGS: Mild gaseous prominence of upper small bowel without definitive obstruction. Interim placement of right-sided ureteral stent. No definitive radiopaque calculi. IMPRESSION: Placement of a right-sided ureteral stent. Nonobstructed bowel-gas pattern Electronically Signed   By: Donavan Foil M.D.   On: 05/05/2019 18:21   US RENAL  Result Date: 05/06/2019 CLINICAL DATA:  Acute kidney injury. History of left nephro ureterectomy. EXAM: RENAL / URINARY TRACT ULTRASOUND COMPLETE COMPARISON:  06/16/2018 FINDINGS: Right Kidney: Renal measurements: 13.7 x 6.4 x 7.3 cm = volume: 335 mL . Echogenicity within normal limits. No mass or hydronephrosis visualized. Left Kidney: Surgically absent. Bladder: Decompressed by Foley catheter. Other: None. IMPRESSION: 1. Unremarkable right kidney.  Specifically, no hydronephrosis. 2. Left kidney surgically absent. Electronically Signed   By: Misty Stanley M.D.   On: 05/06/2019 08:50   DG CHEST PORT 1 VIEW  Result Date: 05/06/2019 CLINICAL DATA:  Shortness of breath EXAM: PORTABLE CHEST 1 VIEW COMPARISON:  Chest CT April 29, 2010 FINDINGS: There is consolidation in the medial right base region. There is subtle ill-defined opacity in each upper lobe. Lungs elsewhere clear. Heart size and pulmonary vascularity are normal. No adenopathy. No bone lesions. IMPRESSION: Airspace opacity medial right base with more subtle opacity in each upper lobe. Appearance consistent with pneumonia, most notable in the medial right base. Cardiac silhouette within normal limits. No adenopathy. Electronically Signed   By: Lowella Grip III M.D.   On: 05/06/2019 12:40   DG C-Arm 1-60 Min-No Report  Result Date: 05/04/2019 Fluoroscopy was utilized by the requesting physician.  No radiographic interpretation.        Scheduled Meds: . atorvastatin  20 mg Oral [id number]  . Chlorhexidine Gluconate Cloth  6 each Topical q morning - 10a  . heparin  5,000 Units Subcutaneous Q8H  .  metoprolol tartrate  5 mg Intravenous Once  . polyethylene glycol  17 g Oral Daily   Continuous Infusions: . sodium chloride 125 mL/hr at 05/06/19 1525  . cefTRIAXone (ROCEPHIN)  IV Stopped (05/06/19 0736)  . diltiazem (CARDIZEM) infusion 7.5 mg/hr (05/06/19 1400)     Time spent: 35 minutes with over 50% of the time coordinating the patient's care    Harold Hedge, DO [facility] Pager [phone]  Call night coverage person covering after 7pm

## 2019-05-06 NOTE — Progress Notes (Signed)
Urology Inpatient Progress Report  Ureterolithiasis [N20.1] ARF (acute renal failure) (HCC) [N17.9] Right kidney stone [N20.0] Acute renal failure (ARF) ([place]) [N17.9]  Procedure(s): CYSTOSCOPY/URETEROSCOPY/STENT PLACEMENT  2 Days Post-Op   Intv/Subj: No acute events overnight. Patient is without complaint. Walking halls, had small BM, minimal pain.  Principal Problem:   Acute renal failure (ARF) ([place]) Active Problems:   Diabetes ([place])   Essential hypertension   Hyperlipidemia   Acute pyelonephritis  Current Facility-Administered Medications  Medication Dose Route Frequency Provider Last Rate Last Admin  . 0.9 %  sodium chloride infusion   Intravenous Continuous Elwyn Reach, MD 125 mL/hr at 05/06/19 0616 New Bag at 05/06/19 0616  . acetaminophen (TYLENOL) tablet 650 mg  650 mg Oral Q6H PRN Elwyn Reach, MD   650 mg at 05/06/19 0157   Or  . acetaminophen (TYLENOL) suppository 650 mg  650 mg Rectal Q6H PRN Gala Romney L, MD      . atorvastatin (LIPITOR) tablet 20 mg  20 mg Oral Doree Barthel, MD   20 mg at 05/06/19 0657  . calcium carbonate (dosed in mg elemental calcium) suspension 500 mg of elemental calcium  500 mg of elemental calcium Oral Q6H PRN Elwyn Reach, MD      . camphor-menthol (SARNA) lotion 1 application  1 application Topical J6O PRN Elwyn Reach, MD       And  . hydrOXYzine (ATARAX/VISTARIL) tablet 25 mg  25 mg Oral Q8H PRN Gala Romney L, MD      . cefTRIAXone (ROCEPHIN) 2 g in sodium chloride 0.9 % 100 mL IVPB  2 g Intravenous Q24H Lang Snow, FNP   Stopped at 05/06/19 [phone]  . Chlorhexidine Gluconate Cloth 2 % PADS 6 each  6 each Topical q morning - 10a Harold Hedge, MD   6 each at 05/06/19 [phone]  . diltiazem (CARDIZEM) 125 mg in dextrose 5% 125 mL (1 mg/mL) infusion  5-15 mg/hr Intravenous Titrated Harold Hedge, MD 5 mL/hr at 05/06/19 1040 5 mg/hr at 05/06/19 1040  . docusate sodium (ENEMEEZ) enema 283 mg  1 enema  Rectal PRN Gala Romney L, MD      . feeding supplement (NEPRO CARB STEADY) liquid 237 mL  237 mL Oral TID PRN Elwyn Reach, MD      . heparin injection 5,000 Units  5,000 Units Subcutaneous Q8H Lang Snow, FNP   5,000 Units at 05/06/19 0526  . HYDROcodone-acetaminophen (NORCO/VICODIN) 5-325 MG per tablet 1 tablet  1 tablet Oral Q4H PRN Harold Hedge, MD   1 tablet at 05/06/19 [phone]  . metoprolol tartrate (LOPRESSOR) injection 5 mg  5 mg Intravenous Once Harold Hedge, MD      . ondansetron [facility]) tablet 4 mg  4 mg Oral Q6H PRN Elwyn Reach, MD       Or  . ondansetron (ZOFRAN) injection 4 mg  4 mg Intravenous Q6H PRN Gala Romney L, MD      . polyethylene glycol (MIRALAX / GLYCOLAX) packet 17 g  17 g Oral Daily Harold Hedge, MD   17 g at 05/06/19 0855  . senna (SENOKOT) tablet 8.6 mg  1 tablet Oral Daily PRN Harold Hedge, MD   8.6 mg at 05/05/19 1819  . sorbitol 70 % solution 30 mL  30 mL Oral PRN Gala Romney L, MD      . zolpidem (AMBIEN) tablet 5 mg  5 mg Oral QHS PRN Gala Romney  L, MD         Objective: Vital: Vitals:   05/06/19 0336 05/06/19 0603 05/06/19 0851 05/06/19 1049  BP:  127/77 139/90 112/77  Pulse:  (!) 104 (!) 107 70  Resp:  20 (!) 26   Temp: 98.3 F (36.8 C) 98.4 F (36.9 C) 98.7 F (37.1 C)   TempSrc: Oral Oral Oral   SpO2:  97% 92% 90%  Weight:      Height:       I/Os: I/O last 3 completed shifts: In: 5104 [P.O.:240; I.V.:4764; IV Piggyback:100] Out: 980 [Urine:975; Blood:5]  Physical Exam:  General: Patient is in no apparent distress Lungs: Normal respiratory effort, chest expands symmetrically. GI: The abdomen is soft and nontender without mass. Foley: clear yellow  Ext: lower extremities symmetric  Lab Results: Recent Labs    05/04/19 1827 05/05/19 0127 05/06/19 0441  WBC 17.9* 9.4 11.4*  HGB 14.7 11.9* 11.6*  HCT 41.8 35.0* 34.4*   Recent Labs    05/04/19 1826 05/05/19 0127 05/06/19 0441  NA 139 137 136   K 5.6* 5.1 4.8  CL 102 107 106  CO2 26 20* 18*  GLUCOSE 182* 144* 118*  BUN 43* 45* 77*  CREATININE 5.28* 5.07* 7.85*  CALCIUM 9.7 7.7* 7.7*   No results for input(s): LABPT, INR in the last 72 hours. No results for input(s): LABURIN in the last 72 hours. Results for orders placed or performed during the hospital encounter of 05/04/19  Respiratory Panel by RT PCR (Flu A&B, Covid) - Nasopharyngeal Swab     Status: None   Collection Time: 05/04/19  8:33 PM   Specimen: Nasopharyngeal Swab  Result Value Ref Range Status   SARS Coronavirus 2 by RT PCR NEGATIVE NEGATIVE Final    Comment: (NOTE) SARS-CoV-2 target nucleic acids are NOT DETECTED. The SARS-CoV-2 RNA is generally detectable in upper respiratoy specimens during the acute phase of infection. The lowest concentration of SARS-CoV-2 viral copies this assay can detect is 131 copies/mL. A negative result does not preclude SARS-Cov-2 infection and should not be used as the sole basis for treatment or other patient management decisions. A negative result may occur with  improper specimen collection/handling, submission of specimen other than nasopharyngeal swab, presence of viral mutation(s) within the areas targeted by this assay, and inadequate number of viral copies (<131 copies/mL). A negative result must be combined with clinical observations, patient history, and epidemiological information. The expected result is Negative. Fact Sheet for Patients:  PinkCheek.be Fact Sheet for Healthcare Providers:  GravelBags.it This test is not yet ap proved or cleared by the [place] FDA and  has been authorized for detection and/or diagnosis of SARS-CoV-2 by FDA under an Emergency Use Authorization (EUA). This EUA will remain  in effect (meaning this test can be used) for the duration of the COVID-19 declaration under Section 564(b)(1) of the Act, 21 U.S.C. section  360bbb-3(b)(1), unless the authorization is terminated or revoked sooner.    Influenza A by PCR NEGATIVE NEGATIVE Final   Influenza B by PCR NEGATIVE NEGATIVE Final    Comment: (NOTE) The Xpert Xpress SARS-CoV-2/FLU/RSV assay is intended as an aid in  the diagnosis of influenza from Nasopharyngeal swab specimens and  should not be used as a sole basis for treatment. Nasal washings and  aspirates are unacceptable for Xpert Xpress SARS-CoV-2/FLU/RSV  testing. Fact Sheet for Patients: PinkCheek.be Fact Sheet for Healthcare Providers: GravelBags.it This test is not yet approved or cleared by the [place]  FDA and  has been authorized for detection and/or diagnosis of SARS-CoV-2 by  FDA under an Emergency Use Authorization (EUA). This EUA will remain  in effect (meaning this test can be used) for the duration of the  Covid-19 declaration under Section 564(b)(1) of the Act, 21  U.S.C. section 360bbb-3(b)(1), unless the authorization is  terminated or revoked. Performed at [facility], [place] [address] [place], [place] [address]   Urine Culture     Status: Abnormal (Preliminary result)   Collection Time: 05/04/19 10:53 PM   Specimen: Urine, Cystoscope  Result Value Ref Range Status   Specimen Description   Final    URINE, CLEAN CATCH Performed at [facility], [place] [address] [place], [place] [address]    Special Requests   Final    CYSTOSCOPE Performed at [facility], [place] [address] [place], [place] [address]    Culture >=100,000 COLONIES/mL GRAM NEGATIVE RODS (A)  Final   Report Status PENDING  Incomplete  Culture, blood (routine x 2)     Status: None (Preliminary result)   Collection Time: 05/05/19  1:27 AM   Specimen: BLOOD  Result Value Ref Range Status   Specimen Description   Final    BLOOD RIGHT ANTECUBITAL Performed at [place] [address] [place], [place] [address]    Special Requests   Final    BOTTLES DRAWN AEROBIC AND ANAEROBIC Blood Culture adequate volume Performed at [place] [address], [place], [place] [address]    Culture   Final    NO GROWTH 1 DAY Performed at [facility], [place] [address], [place], [place] [address]    Report Status PENDING  Incomplete  Culture, blood (routine x 2)     Status: None (Preliminary result)   Collection Time: 05/05/19  1:27 AM   Specimen: BLOOD RIGHT HAND  Result Value Ref Range Status   Specimen Description   Final    BLOOD RIGHT HAND Performed at [facility], [place] [address], [place], [place] [address]    Special Requests   Final    BOTTLES DRAWN AEROBIC ONLY Blood Culture adequate volume Performed at [place] [address] [place] [address], [place], [place] [address]    Culture   Final    NO GROWTH 1 DAY Performed at [facility], [place] [address], [place], [place] [address]    Report Status PENDING  Incomplete    Studies/Results: CT ABDOMEN PELVIS WO CONTRAST  Result Date: 05/04/2019 CLINICAL DATA:  Right flank pain EXAM: CT ABDOMEN AND PELVIS WITHOUT CONTRAST TECHNIQUE: Multidetector CT imaging of the abdomen and pelvis was performed following the standard protocol without IV contrast. COMPARISON:  None. FINDINGS: LOWER CHEST: Normal. HEPATOBILIARY: Normal hepatic contours. No intra- or extrahepatic biliary dilatation. The gallbladder is normal. PANCREAS: Normal pancreas. No ductal dilatation or peripancreatic fluid collection. SPLEEN: Normal. ADRENALS/URINARY TRACT: The adrenal glands are normal. There is a stone within the distal right ureter measuring 4 mm, causing mild hydronephrosis and moderate hydroureter and mild perinephric stranding. Left kidney is surgically absent. No abnormality of the left renal fossa. The urinary bladder is normal for degree of distention STOMACH/BOWEL: There is no hiatal  hernia. Normal duodenal course and caliber. No small bowel dilatation or inflammation. No focal colonic abnormality. Normal appendix. VASCULAR/LYMPHATIC: Normal course and caliber of the major abdominal vessels. No abdominal or pelvic lymphadenopathy. REPRODUCTIVE: Normal prostate size with symmetric  seminal vesicles. MUSCULOSKELETAL. No bony spinal canal stenosis or focal osseous abnormality. OTHER: None. IMPRESSION: 1. Right obstructive uropathy with 4 mm stone in the distal right ureter causing mild hydronephrosis and moderate hydroureter. 2. Status post left nephrectomy. Electronically Signed   By: Ulyses Jarred M.D.   On: 05/04/2019 20:05   DG Abd 1 View  Result Date: 05/05/2019 CLINICAL DATA:  Renal insufficiency right-sided stent EXAM: ABDOMEN - 1 VIEW COMPARISON:  CT 05/04/2019 FINDINGS: Mild gaseous prominence of upper small bowel without definitive obstruction. Interim placement of right-sided ureteral stent. No definitive radiopaque calculi. IMPRESSION: Placement of a right-sided ureteral stent. Nonobstructed bowel-gas pattern Electronically Signed   By: Donavan Foil M.D.   On: 05/05/2019 18:21   US RENAL  Result Date: 05/06/2019 CLINICAL DATA:  Acute kidney injury. History of left nephro ureterectomy. EXAM: RENAL / URINARY TRACT ULTRASOUND COMPLETE COMPARISON:  06/16/2018 FINDINGS: Right Kidney: Renal measurements: 13.7 x 6.4 x 7.3 cm = volume: 335 mL . Echogenicity within normal limits. No mass or hydronephrosis visualized. Left Kidney: Surgically absent. Bladder: Decompressed by Foley catheter. Other: None. IMPRESSION: 1. Unremarkable right kidney.  Specifically, no hydronephrosis. 2. Left kidney surgically absent. Electronically Signed   By: Misty Stanley M.D.   On: 05/06/2019 08:50   DG C-Arm 1-60 Min-No Report  Result Date: 05/04/2019 Fluoroscopy was utilized by the requesting physician.  No radiographic interpretation.    Assessment: Procedure(s): CYSTOSCOPY/URETEROSCOPY/STENT  PLACEMENT, 2 Days Post-Op hemodynamically stable.  Acute renal failure likely combination of obstruction initially, and prerenal azotemia.  His obstruction has been relieved, a KUB was obtained yesterday just to ensure that the stent was in the appropriate position which it seems to be.  He is now making urine, but his renal function is worsened.  His temperature curve is coming down as expected with pyelonephritis.  Plan: Continue the IV antibiotics, follow-up the urine cultures.  I would expect the patient's kidney function to recover with sufficient hydration and time.  Would consider nephrology consult either way.  He will need his stone removed in the coming weeks once his kidney function and infection have improved.  We will continue to follow him while he is in the hospital.   Louis Meckel, MD Urology 05/06/2019, 12:05 PM

## 2019-05-06 NOTE — Consult Note (Signed)
[facility] Admit Date: 05/04/2019 05/06/2019 Rexene Agent Requesting Physician:  Neysa Bonito MD  Reason for Consult:  AKI HPI:  49M admitted 05/04/2019 after presenting with flank pain and found to have obstructed solitary kidney from distal ureteral stone.  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  Baseline creatinine appears to be around 1.5.  Presenting creatinine 5.3 and has increased to 7.85 today.  On the night of admission he underwent cystoscopy with right retrograde pyelogram and placement of right ureteral stent; after obstruction was relieved he had purulent urine consistent with pyelonephritis.  Culture growing GNR's, patient on ceftriaxone.  Potassium 4.8, HCO3 18, BUN 77 this morning.  He made 0.9 L of urine yesterday.  He denies nausea/vomiting, anorexia.  He has been ambulatory throughout the day in the halls.  Currently receiving normal saline at 125 mL/h.    Creatinine, Ser (mg/dL)  Date Value  05/06/2019 7.85 (H)  05/05/2019 5.07 (H)  05/04/2019 5.28 (H)  07/24/2018 1.47 (H)  07/17/2018 1.34 (H)  ] I/Os: I/O last 3 completed shifts: In: 5104 [P.O.:240; I.V.:4764; IV Piggyback:100] Out: 980 [Urine:975; Blood:5]   ROS NSAIDS: Denies use prior to admission IV Contrast no exposure TMP/SMX no exposure Hypotension not present Balance of 12 systems is negative w/ exceptions as above  PMH  Past Medical History:  Diagnosis Date  . Diabetes mellitus without complication ([place])   . GERD (gastroesophageal reflux disease)   . History of kidney stones   . History of torsion of testis   . Hyperlipidemia   . Hypertension   . Left renal mass 06/2018   8.7 cm infiltrating left upper pole renal mass   . Seasonal allergies    PSH  Past Surgical History:  Procedure Laterality Date  . CYSTOSCOPY/URETEROSCOPY/HOLMIUM LASER/STENT PLACEMENT Right 05/04/2019   Procedure: CYSTOSCOPY/URETEROSCOPY/STENT PLACEMENT;  Surgeon: Lucas Mallow, MD;  Location: [facility];  Service: Urology;  Laterality: Right;  . ROBOT ASSITED LAPAROSCOPIC NEPHROURETERECTOMY Left 07/23/2018   Procedure: XI ROBOT ASSITED LAPAROSCOPIC NEPHROURETERECTOMY;  Surgeon: Ardis Hughs, MD;  Location: [facility];  Service: Urology;  Laterality: Left;  . TESTICLE SURGERY     age 49  . WISDOM TOOTH EXTRACTION     FH History reviewed. No pertinent family history. SH  reports that he has never smoked. He has never used smokeless tobacco. He reports previous alcohol use. He reports that he does not use drugs. Allergies No Known Allergies Home medications Prior to Admission medications   Medication Sig Start Date End Date Taking? Authorizing Provider  atorvastatin (LIPITOR) 20 MG tablet Take 20 mg by mouth every morning.   Yes [provider]  diphenhydrAMINE (BENADRYL) 25 mg capsule Take 25 mg by mouth daily as needed for allergies. Equate Allergy Relief   Yes [provider]  lisinopril-hydrochlorothiazide (ZESTORETIC) 20-25 MG tablet Take 1 tablet by mouth daily.   Yes [provider]  metFORMIN (GLUCOPHAGE-XR) 500 MG 24 hr tablet Take 1,000 mg by mouth every Monday, Wednesday, and Friday.    Yes [provider]  sulfamethoxazole-trimethoprim (BACTRIM DS) 800-160 MG tablet Take 1 tablet by mouth 2 (two) times daily. Start the day prior to foley removal appointment Patient not taking: Reported on 05/04/2019 07/23/18   Debbrah Alar, PA-C  traMADol (ULTRAM) 50 MG tablet Take 1-2 tablets (50-100 mg total) by mouth every 6 (six) hours as needed for moderate pain or severe pain. Patient not  taking: Reported on 05/04/2019 07/23/18   Debbrah Alar, PA-C    Current Medications Scheduled Meds: . atorvastatin  20 mg Oral [id number]  . Chlorhexidine Gluconate Cloth  6 each Topical q morning - 10a  . heparin  5,000 Units Subcutaneous Q8H  . metoprolol tartrate  5 mg  Intravenous Once  . polyethylene glycol  17 g Oral Daily   Continuous Infusions: . sodium chloride 125 mL/hr at 05/06/19 0616  . cefTRIAXone (ROCEPHIN)  IV Stopped (05/06/19 0736)  . diltiazem (CARDIZEM) infusion 7.5 mg/hr (05/06/19 1223)   PRN Meds:.acetaminophen **OR** acetaminophen, calcium carbonate (dosed in mg elemental calcium), camphor-menthol **AND** hydrOXYzine, docusate sodium, feeding supplement (NEPRO CARB STEADY), HYDROcodone-acetaminophen, ondansetron **OR** ondansetron (ZOFRAN) IV, senna, sorbitol, zolpidem  CBC Recent Labs  Lab 05/04/19 1827 05/05/19 0127 05/06/19 0441  WBC 17.9* 9.4 11.4*  NEUTROABS 16.5*  --   --   HGB 14.7 11.9* 11.6*  HCT 41.8 35.0* 34.4*  MCV 91.7 92.6 94.0  PLT 160 116* 381*   Basic Metabolic Panel Recent Labs  Lab 05/04/19 1826 05/05/19 0127 05/06/19 0441  NA 139 137 136  K 5.6* 5.1 4.8  CL 102 107 106  CO2 26 20* 18*  GLUCOSE 182* 144* 118*  BUN 43* 45* 77*  CREATININE 5.28* 5.07* 7.85*  CALCIUM 9.7 7.7* 7.7*    Physical Exam  Blood pressure 112/77, pulse 70, temperature 98.7 F (37.1 C), temperature source Oral, resp. rate (!) 26, height 5\' 6"  (1.676 m), weight 81.6 kg, SpO2 90 %. GEN: NAD, well-appearing ENT: NCAT EYES: EOMI CV: Tachycardic, regular PULM: Clear bilaterally ABD: Soft, nontender, normal active bowel sounds SKIN: No rashes or lesions EXT: No peripheral edema Nonfocal, CN II through XII intact, no asterixis   Assessment 49M with solitary kidney presenting with obstructing distal ureteral stone with complication of pyelonephritis; now with AKI  1. AKI; BL SCr 1.5; etiology including obstructive nephropathy; pyelonephritis; likely progression to ATN; use of ACE inhibitor prior to admission.  Not currently uremic K and HCO3 are stable; no immediate indication for dialysis.  Hopefully in time tubular function will recover GFR will improve without needing dialysis in the interval period. 2. Pyelonephritis,  upstream of obstruction; GNR's on urine culture; blood cultures no growth to date; ceftriaxone 3. Distal ureteral stone, obstructing, status post ureteral stent; stone not yet extracted 4. 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 Hypertension on ACE inhibitor thiazide; meds held, blood pressure stable 6. DM2 on Metformin, medication held  Plan 1. Largely as above, continue supportive care, IV fluids, treatment of infection 2. Not much margin for worsening; might require temporary dialysis before he improves; will follow closely at the current time 3. Daily weights, Daily Renal Panel, Strict I/Os, Avoid nephrotoxins (NSAIDs, judicious IV Contrast)   Rexene Agent  [phone] pgr 05/06/2019, 12:48 PM

## 2019-05-06 NOTE — Progress Notes (Signed)
  Echocardiogram 2D Echocardiogram has been performed.  Darlina Sicilian M 05/06/2019, 2:32 PM

## 2019-05-06 NOTE — Progress Notes (Signed)
Late entry.    I saw the patient yesterday afternoon.    Despite stent placement, he has had low urine output.  Creatinine is minimally changed.  Obtain a KUB yesterday evening that revealed adequate positioning of the stent.  His pain is improved.    He is afebrile with I will sign stable  cultures pending   on exam he has a Foley catheter in place with minimal output.  I irrigated the bladder and confirmed positioning of the catheter and confirmed there was no obstruction.  The bladder was empty.  Abdomen is soft, nontender, nondistended.    KUB imaging shows stent on the right in an adequate position   assessment:   Right ureteral calculus   Sepsis secondary to urinary tract infection, improving  acute renal insufficiency   plan:   Stent appears to be in good position.  Obtain renal ultrasound if creatinine does not improve.  Involve Nephrology if creatinine does not improve.  Suspect this may be secondary to ATN.  His pressure did drop down to 66Q systolic in the operating room and  He required temporary pressor support before recovering.  Agree with continuing antibiotics.

## 2019-05-07 ENCOUNTER — Inpatient Hospital Stay (HOSPITAL_COMMUNITY): Payer: Commercial Managed Care - PPO

## 2019-05-07 DIAGNOSIS — J189 Pneumonia, unspecified organism: Secondary | ICD-10-CM

## 2019-05-07 HISTORY — PX: IR FLUORO GUIDE CV LINE RIGHT: IMG2283

## 2019-05-07 HISTORY — PX: IR US GUIDE VASC ACCESS RIGHT: IMG2390

## 2019-05-07 LAB — BASIC METABOLIC PANEL
Anion gap: 13 (ref 5–15)
BUN: 96 mg/dL — ABNORMAL HIGH (ref 6–20)
CO2: 16 mmol/L — ABNORMAL LOW (ref 22–32)
Calcium: 7.9 mg/dL — ABNORMAL LOW (ref 8.9–10.3)
Chloride: 104 mmol/L (ref 98–111)
Creatinine, Ser: 8.86 mg/dL — ABNORMAL HIGH (ref 0.61–1.24)
GFR calc Af Amer: 7 mL/min — ABNORMAL LOW (ref >60)
GFR calc non Af Amer: 6 mL/min — ABNORMAL LOW (ref >60)
Glucose, Bld: 113 mg/dL — ABNORMAL HIGH (ref 70–99)
Potassium: 5 mmol/L (ref 3.5–5.1)
Sodium: 133 mmol/L — ABNORMAL LOW (ref 135–145)

## 2019-05-07 LAB — MAGNESIUM: Magnesium: 2.4 mg/dL (ref 1.7–2.4)

## 2019-05-07 LAB — CBC
HCT: 33.3 % — ABNORMAL LOW (ref 39.0–52.0)
Hemoglobin: 11.2 g/dL — ABNORMAL LOW (ref 13.0–17.0)
MCH: 31.8 pg (ref 26.0–34.0)
MCHC: 33.6 g/dL (ref 30.0–36.0)
MCV: 94.6 fL (ref 80.0–100.0)
Platelets: 120 10*3/uL — ABNORMAL LOW (ref 150–400)
RBC: 3.52 MIL/uL — ABNORMAL LOW (ref 4.22–5.81)
RDW: 12.9 % (ref 11.5–15.5)
WBC: 8.7 10*3/uL (ref 4.0–10.5)
nRBC: 0 % (ref 0.0–0.2)

## 2019-05-07 LAB — URINE CULTURE: Culture: >=100000 — AB

## 2019-05-07 MED ORDER — SODIUM CHLORIDE 0.9% FLUSH
10.0000 mL | Freq: Two times a day (BID) | INTRAVENOUS | Status: DC
  Administered 2019-05-08 – 2019-05-09 (×4): 10 mL

## 2019-05-07 MED ORDER — LIDOCAINE HCL 1 % IJ SOLN
INTRAMUSCULAR | Status: AC
  Filled 2019-05-07: qty 20

## 2019-05-07 MED ORDER — FUROSEMIDE 10 MG/ML IJ SOLN
80.0000 mg | Freq: Once | INTRAMUSCULAR | Status: AC
  Administered 2019-05-07: 80 mg via INTRAVENOUS
  Filled 2019-05-07: qty 8

## 2019-05-07 MED ORDER — CHLORHEXIDINE GLUCONATE CLOTH 2 % EX PADS
6.0000 | MEDICATED_PAD | Freq: Every day | CUTANEOUS | Status: DC
  Administered 2019-05-08 – 2019-05-11 (×4): 6 via TOPICAL

## 2019-05-07 MED ORDER — SODIUM CHLORIDE 0.9% FLUSH
10.0000 mL | INTRAVENOUS | Status: DC | PRN
  Administered 2019-05-11: 10 mL

## 2019-05-07 MED ORDER — HEPARIN SODIUM (PORCINE) 1000 UNIT/ML IJ SOLN
INTRAMUSCULAR | Status: AC
  Administered 2019-05-07: 3 mL
  Filled 2019-05-07: qty 1

## 2019-05-07 MED ORDER — LIDOCAINE HCL 1 % IJ SOLN
INTRAMUSCULAR | Status: DC | PRN
  Administered 2019-05-07: 5 mL

## 2019-05-07 MED ORDER — SODIUM CHLORIDE 0.9 % IV BOLUS
250.0000 mL | Freq: Once | INTRAVENOUS | Status: AC
  Administered 2019-05-07: 250 mL via INTRAVENOUS

## 2019-05-07 MED ORDER — DILTIAZEM HCL ER COATED BEADS 120 MG PO CP24
120.0000 mg | ORAL_CAPSULE | Freq: Every day | ORAL | Status: DC
  Administered 2019-05-07 – 2019-05-11 (×5): 120 mg via ORAL
  Filled 2019-05-07 (×5): qty 1

## 2019-05-07 MED ORDER — ALBUTEROL SULFATE (2.5 MG/3ML) 0.083% IN NEBU
3.0000 mL | INHALATION_SOLUTION | RESPIRATORY_TRACT | Status: DC | PRN
  Administered 2019-05-07: 3 mL via RESPIRATORY_TRACT
  Filled 2019-05-07: qty 3

## 2019-05-07 MED ORDER — FUROSEMIDE 10 MG/ML IJ SOLN
80.0000 mg | Freq: Three times a day (TID) | INTRAMUSCULAR | Status: AC
  Administered 2019-05-07 – 2019-05-08 (×3): 80 mg via INTRAVENOUS
  Filled 2019-05-07 (×3): qty 8

## 2019-05-07 NOTE — Progress Notes (Signed)
Urology Inpatient Progress Report  Ureterolithiasis [N20.1] ARF (acute renal failure) (HCC) [N17.9] Right kidney stone [N20.0] Acute renal failure (ARF) ([place]) [N17.9]  Procedure(s): CYSTOSCOPY/URETEROSCOPY/STENT PLACEMENT  3 Days Post-Op   Intv/Subj: Patient without significant pain or real complaints.  Has been ambulating well.  Had a bowel movement earlier today.  He is making urine.  However, unfortunately his creatinine continues to trend up.  Renal ultrasound obtained yesterday demonstrates no hydronephrosis.  Principal Problem:   Acute renal failure (ARF) ([place]) Active Problems:   Diabetes ([place])   Essential hypertension   Hyperlipidemia   Acute pyelonephritis  Current Facility-Administered Medications  Medication Dose Route Frequency Provider Last Rate Last Admin  . acetaminophen (TYLENOL) tablet 650 mg  650 mg Oral Q6H PRN Elwyn Reach, MD   650 mg at 05/06/19 2247   Or  . acetaminophen (TYLENOL) suppository 650 mg  650 mg Rectal Q6H PRN Gala Romney L, MD      . atorvastatin (LIPITOR) tablet 20 mg  20 mg Oral Doree Barthel, MD   20 mg at 05/07/19 0718  . calcium carbonate (dosed in mg elemental calcium) suspension 500 mg of elemental calcium  500 mg of elemental calcium Oral Q6H PRN Elwyn Reach, MD      . camphor-menthol (SARNA) lotion 1 application  1 application Topical Z3G PRN Elwyn Reach, MD       And  . hydrOXYzine (ATARAX/VISTARIL) tablet 25 mg  25 mg Oral Q8H PRN Gala Romney L, MD      . cefTRIAXone (ROCEPHIN) 2 g in sodium chloride 0.9 % 100 mL IVPB  2 g Intravenous Q24H Lang Snow, FNP 200 mL/hr at 05/06/19 2250 2 g at 05/06/19 2250  . Chlorhexidine Gluconate Cloth 2 % PADS 6 each  6 each Topical q morning - 10a Harold Hedge, MD   6 each at 05/07/19 1123  . [START ON 05/08/2019] Chlorhexidine Gluconate Cloth 2 % PADS 6 each  6 each Topical Q0600 Roney Jaffe, MD      . diltiazem (CARDIZEM CD) 24 hr capsule 120 mg  120 mg  Oral Daily Harold Hedge, MD      . docusate sodium [facility]) enema 283 mg  1 enema Rectal PRN Elwyn Reach, MD      . doxycycline (VIBRA-TABS) tablet 100 mg  100 mg Oral Q12H Harold Hedge, MD   100 mg at 05/07/19 1007  . feeding supplement (NEPRO CARB STEADY) liquid 237 mL  237 mL Oral TID PRN Elwyn Reach, MD      . furosemide (LASIX) injection 80 mg  80 mg Intravenous Q8H Roney Jaffe, MD      . heparin injection 5,000 Units  5,000 Units Subcutaneous Q8H Lang Snow, FNP   5,000 Units at 05/07/19 1639  . HYDROcodone-acetaminophen (NORCO/VICODIN) 5-325 MG per tablet 1 tablet  1 tablet Oral Q4H PRN Harold Hedge, MD   1 tablet at 05/06/19 1535  . lidocaine (XYLOCAINE) 1 % (with pres) injection           . lidocaine (XYLOCAINE) 1 % (with pres) injection    PRN Arne Cleveland, MD   5 mL at 05/07/19 1606  . metoprolol tartrate (LOPRESSOR) injection 5 mg  5 mg Intravenous Once Harold Hedge, MD      . ondansetron [facility]) tablet 4 mg  4 mg Oral Q6H PRN Elwyn Reach, MD       Or  . ondansetron [facility]) injection  4 mg  4 mg Intravenous Q6H PRN Gala Romney L, MD      . polyethylene glycol (MIRALAX / GLYCOLAX) packet 17 g  17 g Oral Daily Harold Hedge, MD   17 g at 05/07/19 1007  . senna (SENOKOT) tablet 8.6 mg  1 tablet Oral Daily PRN Harold Hedge, MD   8.6 mg at 05/05/19 1819  . sorbitol 70 % solution 30 mL  30 mL Oral PRN Elwyn Reach, MD         Objective: Vital: Vitals:   05/07/19 0745 05/07/19 1018 05/07/19 1201 05/07/19 1634  BP:  (!) 141/89 134/89 (!) 142/88  Pulse:  92 90 89  Resp:  (!) 21  16  Temp:  98.6 F (37 C) 98 F (36.7 C) 98.1 F (36.7 C)  TempSrc:  Oral Oral Oral  SpO2:  93% 94% 96%  Weight: 87.2 kg     Height:       I/Os: I/O last 3 completed shifts: In: 3965.2 [P.O.:240; I.V.:3525.2; IV Piggyback:200] Out: 975 [Urine:975]  Physical Exam:  General: Patient is in no apparent distress Lungs: Normal respiratory effort, chest  expands symmetrically. GI: The abdomen is soft and nontender without mass. Foley: clear yellow  Ext: lower extremities symmetric  Lab Results: Recent Labs    05/05/19 0127 05/06/19 0441 05/07/19 0430  WBC 9.4 11.4* 8.7  HGB 11.9* 11.6* 11.2*  HCT 35.0* 34.4* 33.3*   Recent Labs    05/05/19 0127 05/06/19 0441 05/07/19 0430  NA 137 136 133*  K 5.1 4.8 5.0  CL 107 106 104  CO2 20* 18* 16*  GLUCOSE 144* 118* 113*  BUN 45* 77* 96*  CREATININE 5.07* 7.85* 8.86*  CALCIUM 7.7* 7.7* 7.9*   No results for input(s): LABPT, INR in the last 72 hours. No results for input(s): LABURIN in the last 72 hours. Results for orders placed or performed during the hospital encounter of 05/04/19  Respiratory Panel by RT PCR (Flu A&B, Covid) - Nasopharyngeal Swab     Status: None   Collection Time: 05/04/19  8:33 PM   Specimen: Nasopharyngeal Swab  Result Value Ref Range Status   SARS Coronavirus 2 by RT PCR NEGATIVE NEGATIVE Final    Comment: (NOTE) SARS-CoV-2 target nucleic acids are NOT DETECTED. The SARS-CoV-2 RNA is generally detectable in upper respiratoy specimens during the acute phase of infection. The lowest concentration of SARS-CoV-2 viral copies this assay can detect is 131 copies/mL. A negative result does not preclude SARS-Cov-2 infection and should not be used as the sole basis for treatment or other patient management decisions. A negative result may occur with  improper specimen collection/handling, submission of specimen other than nasopharyngeal swab, presence of viral mutation(s) within the areas targeted by this assay, and inadequate number of viral copies (<131 copies/mL). A negative result must be combined with clinical observations, patient history, and epidemiological information. The expected result is Negative. Fact Sheet for Patients:  PinkCheek.be Fact Sheet for Healthcare Providers:   GravelBags.it This test is not yet ap proved or cleared by the [place] FDA and  has been authorized for detection and/or diagnosis of SARS-CoV-2 by FDA under an Emergency Use Authorization (EUA). This EUA will remain  in effect (meaning this test can be used) for the duration of the COVID-19 declaration under Section 564(b)(1) of the Act, 21 U.S.C. section 360bbb-3(b)(1), unless the authorization is terminated or revoked sooner.    Influenza A by PCR NEGATIVE NEGATIVE Final  Influenza B by PCR NEGATIVE NEGATIVE Final    Comment: (NOTE) The Xpert Xpress SARS-CoV-2/FLU/RSV assay is intended as an aid in  the diagnosis of influenza from Nasopharyngeal swab specimens and  should not be used as a sole basis for treatment. Nasal washings and  aspirates are unacceptable for Xpert Xpress SARS-CoV-2/FLU/RSV  testing. Fact Sheet for Patients: PinkCheek.be Fact Sheet for Healthcare Providers: GravelBags.it This test is not yet approved or cleared by the [place] FDA and  has been authorized for detection and/or diagnosis of SARS-CoV-2 by  FDA under an Emergency Use Authorization (EUA). This EUA will remain  in effect (meaning this test can be used) for the duration of the  Covid-19 declaration under Section 564(b)(1) of the Act, 21  U.S.C. section 360bbb-3(b)(1), unless the authorization is  terminated or revoked. Performed at [facility], [place] [address], [place], [place] [address]   Urine Culture     Status: Abnormal   Collection Time: 05/04/19 10:53 PM   Specimen: Urine, Cystoscope  Result Value Ref Range Status   Specimen Description   Final    URINE, CLEAN CATCH Performed at [facility], [place] [address] [place], [place] [address]    Special Requests   Final    CYSTOSCOPE Performed at [facility], [place] [address],  [place], [place] [address]    Culture >=100,000 COLONIES/mL ESCHERICHIA COLI (A)  Final   Report Status 05/07/2019 FINAL  Final   Organism ID, Bacteria ESCHERICHIA COLI (A)  Final      Susceptibility   Escherichia coli - MIC*    AMPICILLIN <=2 SENSITIVE Sensitive     CEFAZOLIN <=4 SENSITIVE Sensitive     CEFTRIAXONE <=0.25 SENSITIVE Sensitive     CIPROFLOXACIN <=0.25 SENSITIVE Sensitive     GENTAMICIN <=1 SENSITIVE Sensitive     IMIPENEM <=0.25 SENSITIVE Sensitive     NITROFURANTOIN <=16 SENSITIVE Sensitive     TRIMETH/SULFA <=20 SENSITIVE Sensitive     AMPICILLIN/SULBACTAM <=2 SENSITIVE Sensitive     PIP/TAZO <=4 SENSITIVE Sensitive     * >=100,000 COLONIES/mL ESCHERICHIA COLI  Culture, blood (routine x 2)     Status: None (Preliminary result)   Collection Time: 05/05/19  1:27 AM   Specimen: BLOOD  Result Value Ref Range Status   Specimen Description   Final    BLOOD RIGHT ANTECUBITAL Performed at [place] [address], [place], [place] [address]    Special Requests   Final    BOTTLES DRAWN AEROBIC AND ANAEROBIC Blood Culture adequate volume Performed at [place] [address] [place], [place] [address]    Culture   Final    NO GROWTH 2 DAYS Performed at [place] [address] [place], [place] [address]    Report Status PENDING  Incomplete  Culture, blood (routine x 2)     Status: None (Preliminary result)   Collection Time: 05/05/19  1:27 AM   Specimen: BLOOD RIGHT HAND  Result Value Ref Range Status   Specimen Description   Final    BLOOD RIGHT HAND Performed at [facility], [place] [address], [place], [place] [address]    Special Requests   Final    BOTTLES DRAWN AEROBIC ONLY Blood Culture adequate volume Performed at [place] [address], [place], [place] [address]    Culture   Final    NO GROWTH 2 DAYS Performed at [facility], [address] [place], [place]  [address]    Report Status PENDING  Incomplete    Studies/Results: DG Abd 1 View  Result Date: 05/05/2019 CLINICAL DATA:  Renal insufficiency right-sided stent EXAM: ABDOMEN - 1 VIEW COMPARISON:  CT 05/04/2019 FINDINGS: Mild gaseous prominence of upper small bowel without definitive obstruction. Interim placement of right-sided ureteral stent. No definitive radiopaque calculi. IMPRESSION: Placement of a right-sided ureteral stent. Nonobstructed bowel-gas pattern Electronically Signed   By: Donavan Foil M.D.   On: 05/05/2019 18:21   US RENAL  Result Date: 05/06/2019 CLINICAL DATA:  Acute kidney injury. History of left nephro ureterectomy. EXAM: RENAL / URINARY TRACT ULTRASOUND COMPLETE COMPARISON:  06/16/2018 FINDINGS: Right Kidney: Renal measurements: 13.7 x 6.4 x 7.3 cm = volume: 335 mL . Echogenicity within normal limits. No mass or hydronephrosis visualized. Left Kidney: Surgically absent. Bladder: Decompressed by Foley catheter. Other: None. IMPRESSION: 1. Unremarkable right kidney.  Specifically, no hydronephrosis. 2. Left kidney surgically absent. Electronically Signed   By: Misty Stanley M.D.   On: 05/06/2019 08:50   IR Fluoro Guide CV Line Right  Result Date: 05/07/2019 CLINICAL DATA:  Acute renal failure, needs access for planned dialysis EXAM: EXAM RIGHT IJ CATHETER PLACEMENT UNDER ULTRASOUND AND FLUOROSCOPIC GUIDANCE TECHNIQUE: The procedure, risks (including but not limited to bleeding, infection, organ damage, pneumothorax), benefits, and alternatives were explained to the patient. Questions regarding the procedure were encouraged and answered. The patient understands and consents to the procedure. Patency of the right IJ vein was confirmed with ultrasound with image documentation. An appropriate skin site was determined. Skin site was marked. Region was prepped using maximum barrier technique including cap and mask, sterile gown, sterile gloves, large sterile sheet, and Chlorhexidine as  cutaneous antisepsis. The region was infiltrated locally with 1% lidocaine. Under real-time ultrasound guidance, the right IJ vein was accessed with a 19 gauge needle; the needle tip within the vein was confirmed with ultrasound image documentation. The needle exchanged over a guidewire for vascular dilator which allowed advancement of a 20 cm Trialysis catheter. This was positioned with the tip at the cavoatrial junction. Spot chest radiograph shows good positioning and no pneumothorax. Catheter was flushed and sutured externally with 0-Prolene sutures. Patient tolerated the procedure well. FLUOROSCOPY TIME:  0.1 minute COMPLICATIONS: COMPLICATIONS none IMPRESSION: 1. Technically successful right IJ Trialysis catheter placement. Electronically Signed   By: Lucrezia Europe M.D.   On: 05/07/2019 16:26   IR US Guide Vasc Access Right  Result Date: 05/07/2019 CLINICAL DATA:  Acute renal failure, needs access for planned dialysis EXAM: EXAM RIGHT IJ CATHETER PLACEMENT UNDER ULTRASOUND AND FLUOROSCOPIC GUIDANCE TECHNIQUE: The procedure, risks (including but not limited to bleeding, infection, organ damage, pneumothorax), benefits, and alternatives were explained to the patient. Questions regarding the procedure were encouraged and answered. The patient understands and consents to the procedure. Patency of the right IJ vein was confirmed with ultrasound with image documentation. An appropriate skin site was determined. Skin site was marked. Region was prepped using maximum barrier technique including cap and mask, sterile gown, sterile gloves, large sterile sheet, and Chlorhexidine as cutaneous antisepsis. The region was infiltrated locally with 1% lidocaine. Under real-time ultrasound guidance, the right IJ vein was accessed with a 19 gauge needle; the needle tip within the vein was confirmed with ultrasound image documentation. The needle exchanged over a guidewire for vascular dilator which allowed advancement of a 20  cm Trialysis catheter. This was positioned with the tip at the cavoatrial  junction. Spot chest radiograph shows good positioning and no pneumothorax. Catheter was flushed and sutured externally with 0-Prolene sutures. Patient tolerated the procedure well. FLUOROSCOPY TIME:  0.1 minute COMPLICATIONS: COMPLICATIONS none IMPRESSION: 1. Technically successful right IJ Trialysis catheter placement. Electronically Signed   By: Lucrezia Europe M.D.   On: 05/07/2019 16:26   DG CHEST PORT 1 VIEW  Result Date: 05/06/2019 CLINICAL DATA:  Shortness of breath EXAM: PORTABLE CHEST 1 VIEW COMPARISON:  Chest CT April 29, 2010 FINDINGS: There is consolidation in the medial right base region. There is subtle ill-defined opacity in each upper lobe. Lungs elsewhere clear. Heart size and pulmonary vascularity are normal. No adenopathy. No bone lesions. IMPRESSION: Airspace opacity medial right base with more subtle opacity in each upper lobe. Appearance consistent with pneumonia, most notable in the medial right base. Cardiac silhouette within normal limits. No adenopathy. Electronically Signed   By: Lowella Grip III M.D.   On: 05/06/2019 12:40   ECHOCARDIOGRAM COMPLETE  Result Date: 05/06/2019    ECHOCARDIOGRAM REPORT   Patient Name:   ADEMOLA VERT Date of Exam: 05/06/2019 Medical Rec #:  [id number]         Height:       66.0 in Accession #:    [id number]        Weight:       179.8 lb Date of Birth:  1970/06/13         BSA:          1.911 m Patient Age:    49 years          BP:           116/87 mmHg Patient Gender: M                 HR:           103 bpm. Exam Location:  Inpatient Procedure: 2D Echo Indications:    Abnormal ECG 794.31 / R94.31  History:        Patient has no prior history of Echocardiogram examinations.                 Aortic Valve Disease, Signs/Symptoms:Fever; Risk                 Factors:Diabetes and Hypertension. Tachycardia. Kidney Stones.                 Left nephrectomy. Acute ptelonephritis.   Sonographer:    Darlina Sicilian RDCS Referring Phys: [id number] [place]  1. Left ventricular ejection fraction, by estimation, is 60 to 65%. The left ventricle has normal function. The left ventricle has no regional wall motion abnormalities. There is mild concentric left ventricular hypertrophy. Indeterminate diastolic filling due to E-A fusion.  2. Right ventricular systolic function is normal. The right ventricular size is normal. There is normal pulmonary artery systolic pressure. The estimated right ventricular systolic pressure is 34.1 mmHg.  3. The mitral valve is grossly normal. Trivial mitral valve regurgitation. No evidence of mitral stenosis.  4. The aortic valve is tricuspid. Aortic valve regurgitation is not visualized. No aortic stenosis is present.  5. The inferior vena cava is normal in size with <50% respiratory variability, suggesting right atrial pressure of 8 mmHg. FINDINGS  Left Ventricle: Left ventricular ejection fraction, by estimation, is 60 to 65%. The left ventricle has normal function. The left ventricle has no regional wall motion abnormalities. The left ventricular internal cavity size was normal in size. There is  mild concentric left ventricular hypertrophy. Indeterminate diastolic filling due to E-A fusion. Right Ventricle: The right ventricular size is normal. No increase in right ventricular wall thickness. Right ventricular systolic function is normal. There is normal pulmonary artery systolic pressure. The tricuspid regurgitant velocity is 2.57 m/s, and  with an assumed right atrial pressure of 8 mmHg, the estimated right ventricular systolic pressure is 71.6 mmHg. Left Atrium: Left atrial size was normal in size. Right Atrium: Right atrial size was normal in size. Pericardium: There is no evidence of pericardial effusion. Presence of pericardial fat pad. Mitral Valve: The mitral valve is grossly normal. Trivial mitral valve regurgitation. No evidence of mitral  valve stenosis. Tricuspid Valve: The tricuspid valve is grossly normal. Tricuspid valve regurgitation is trivial. No evidence of tricuspid stenosis. Aortic Valve: The aortic valve is tricuspid. Aortic valve regurgitation is not visualized. No aortic stenosis is present. Pulmonic Valve: The pulmonic valve was grossly normal. Pulmonic valve regurgitation is not visualized. No evidence of pulmonic stenosis. Aorta: The aortic root and ascending aorta are structurally normal, with no evidence of dilitation. Venous: The inferior vena cava is normal in size with less than 50% respiratory variability, suggesting right atrial pressure of 8 mmHg. IAS/Shunts: No atrial level shunt detected by color flow Doppler. EKG: Rhythm strip during this exam demostrated sinus tachycardia.  LEFT VENTRICLE PLAX 2D LVIDd:         3.90 cm LVIDs:         2.70 cm LV PW:         1.30 cm LV IVS:        1.20 cm LVOT diam:     2.00 cm LV SV:         52 LV SV Index:   27 LVOT Area:     3.14 cm  RIGHT VENTRICLE RV S prime:     17.70 cm/s TAPSE (M-mode): 2.4 cm LEFT ATRIUM             Index       RIGHT ATRIUM          Index LA diam:        3.30 cm 1.73 cm/m  RA Area:     8.83 cm LA Vol (A2C):   58.8 ml 30.76 ml/m RA Volume:   15.50 ml 8.11 ml/m LA Vol (A4C):   33.6 ml 17.58 ml/m LA Biplane Vol: 46.3 ml 24.22 ml/m  AORTIC VALVE LVOT Vmax:   94.20 cm/s LVOT Vmean:  71.900 cm/s LVOT VTI:    0.167 m  AORTA Ao Root diam: 3.60 cm TRICUSPID VALVE TR Peak grad:   26.4 mmHg TR Vmax:        257.00 cm/s  SHUNTS Systemic VTI:  0.17 m Systemic Diam: 2.00 cm Eleonore Chiquito MD Electronically signed by Eleonore Chiquito MD Signature Date/Time: 05/06/2019/4:19:49 PM    Final     Assessment: Procedure(s): CYSTOSCOPY/URETEROSCOPY/STENT PLACEMENT, 3 Days Post-Op hemodynamically stable.  Acute renal failure likely combination of obstruction initially, dehydration and ATN, unfortunately his renal function has not turned around as quickly as we had hoped.  A KUB  demonstrated that this stent was in the appropriate position, the renal ultrasound demonstrated no hydronephrosis.  He is growing pansensitive E. coli in his urine.  Plan: From a urologic perspective there is not much more we can do.  He needs to be treated adequately for his infection, would recommend 2 weeks of treatment.  We will then plan for him to return once his  renal function improves and has been adequately treated for his infection we will remove the stones.  We will contact him with a surgery date.  We will defer the remaining aspects of his care and treatment to the internal medicine and nephrology team.  Appreciate their help and take care of this patient.  Louis Meckel, MD Urology 05/07/2019, 5:13 PM

## 2019-05-07 NOTE — Progress Notes (Signed)
PROGRESS NOTE    Jorge Welch    Code Status: Full Code  WYO:[id number] DOB: [birth year] DOA: 05/04/2019 LOS: 3 days  PCP: Patient, No Pcp Per CC:  Chief Complaint  Patient presents with  . 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  Urology was consulted and patient was taken to the OR on 3/16 evening for right cystoscopy with right ureteral stent placement and urethral dilation.  3/18: A. fib with RVR started on Cardizem drip.  Renal function worsened with creatinine up to 7.  Nephrology consulted.  Hypoxic, chest x-ray showing concern for pneumonia  A & P   Principal Problem:   Acute renal failure (ARF) (HCC) Active Problems:   Diabetes ([place])   Essential hypertension   Hyperlipidemia   Acute pyelonephritis   1. AKI secondary to obstructive uropathy, s/p cystoscopy with right ureteral stent placement and urethral dilation (05/04/19, Dr. Gloriann Loan) a. Slightly improved post procedure, creatinine 7.85->8.86, baseline 1.3 b. Still poor urine output today c. Holding home lisinopril/HCTZ d. Renal ultrasound unremarkable e. UA with UTI, gross hematuria and proteinuria f. Hold Ambien g. Nephrology consulted -supportive care, may need temporary dialysis, appreciate recommendations 2. Sepsis secondary to E. coli UTI, concern for developing pneumonia sepsis now resolved a. Continue Rocephin, day 4/5-7, Doxycycline Day 2/5 (added on 3/18) b. Blood cultures no growth to date c. follow-up sensitivities 3. Acute hypoxic respiratory failure, concern for developing CAP a. Day 4 Rocephin, day 2 Doxy.  Hold off on azithromycin given arrhythmia b. Likely a bit volume  overloaded, will discuss with nephro if patient can get Lasix  c. Hold IV fluids as personal read of CXR shows fluid in right fissure may be contributing to hypoxia 4. A. fib with RVR Likely secondary to sepsis a. CHA2DS2-VASc: At least 2 (hypertension, diabetes) however off heparin drip given recent urologic procedure and gross hematuria for now b. Improved on Cardizem bolus and drip-may need to transfer to stepdown unit if continues to need titration c. Echo 05/06/19: overall unremarkable d. Goal K >4.0, Goal Mg > 2.0 5. Hypomagnesemia replete 6. Recurrent nephrolithiasis Plan per urology 7. Hypertension Stable off meds 8. Hyperlipidemia Resume home Lipitor 9. Diabetes Continue sliding scale   DVT prophylaxis: Lovenox Family Communication: Discussed with wife at bedside yesterday.  Did not answer phone today Disposition Plan:   Patient came from:   Home                                                                                          Anticipated d/c place: Home  Barriers to d/c: Renal function is worsened and may need transfer to [facility] for temporary HD. Needs improved renal function, respiratory status and treatment of CAP. Several days away from discharge.   Pressure injury documentation    None  Consultants  Urology Nephrology  Procedures  Cystoscopy and ureteral stent placement 3/16  Antibiotics   Anti-infectives (From admission, onward)   Start     Dose/Rate Route Frequency Ordered Stop   05/06/19 2200  doxycycline (VIBRA-TABS) tablet 100 mg     100 mg Oral Every 12 hours 05/06/19 1608     05/05/19 2200  cefTRIAXone (ROCEPHIN) 2 g in sodium chloride 0.9 % 100 mL IVPB     2 g 200 mL/hr over 30 Minutes Intravenous Every 24 hours 05/04/19 2359     05/05/19 0230  cefTRIAXone (ROCEPHIN) 1 g in sodium chloride 0.9 % 100 mL IVPB     1 g 200 mL/hr over 30 Minutes Intravenous  Once 05/04/19 2331 05/05/19 0311   05/04/19 2030  cefTRIAXone (ROCEPHIN) 1 g in sodium  chloride 0.9 % 100 mL IVPB     1 g 200 mL/hr over 30 Minutes Intravenous  Once 05/04/19 2018 05/04/19 2110        Subjective   Admits to persistent but improved abdominal pain. Noted that his urine looks darker today. No chest pain, palpitations. Shortness of breath occasionally when his abdomen is bloated. No other complaints.   Objective   Vitals:   05/06/19 1702 05/06/19 2113 05/07/19 0212 05/07/19 0525  BP: 118/80 112/81 117/89 121/89  Pulse: 95 86 82 89  Resp: (!) 25 18 (!) 22 20  Temp: 97.7 F (36.5 C) 98.4 F (36.9 C) 98.3 F (36.8 C) 98.4 F (36.9 C)  TempSrc: Oral Oral Oral Oral  SpO2: 94% 99% 94% 98%  Weight:      Height:        Intake/Output Summary (Last 24 hours) at 05/07/2019 0716 Last data filed at 05/07/2019 0600 Gross per 24 hour  Intake 2322.33 ml  Output 275 ml  Net 2047.33 ml   Filed Weights   05/05/19 0109 05/05/19 0609  Weight: 83.7 kg 81.6 kg    Examination:  Physical Exam Vitals and nursing note reviewed.  Constitutional:      Appearance: Normal appearance.  HENT:     Head: Normocephalic and atraumatic.  Eyes:     Conjunctiva/sclera: Conjunctivae normal.  Cardiovascular:     Rate and Rhythm: Normal rate and regular rhythm.     Heart sounds: No murmur.  Pulmonary:     Effort: Pulmonary effort is normal.     Breath sounds: Rales present.  Abdominal:     General: Abdomen is flat.     Palpations: Abdomen is soft.  Genitourinary:    Comments: Urine dark/cloudy with sediment Musculoskeletal:        General: No swelling or tenderness.  Skin:    Coloration: Skin is not jaundiced or pale.  Neurological:     Mental Status: He is alert. Mental status is at baseline.  Psychiatric:        Mood and Affect: Mood normal.        Behavior: Behavior normal.     Data Reviewed: I have personally reviewed following labs and imaging studies  CBC: Recent Labs  Lab 05/04/19 1827 05/05/19 0127 05/06/19 0441 05/07/19 0430  WBC 17.9* 9.4  11.4* 8.7  NEUTROABS 16.5*  --   --   --   HGB 14.7 11.9* 11.6* 11.2*  HCT 41.8 35.0* 34.4* 33.3*  MCV 91.7 92.6 94.0 94.6  PLT 160 116* 113* 209*   Basic Metabolic Panel: Recent Labs  Lab 05/04/19 1826 05/05/19 0127 05/06/19 0441 05/06/19 1618 05/07/19 0430  NA 139 137 136  --  133*  K 5.6* 5.1 4.8  --  5.0  CL 102 107 106  --  104  CO2 26 20* 18*  --  16*  GLUCOSE 182* 144* 118*  --  113*  BUN 43* 45* 77*  --  96*  CREATININE 5.28* 5.07* 7.85*  --  8.86*  CALCIUM 9.7 7.7* 7.7*  --  7.9*  MG  --   --   --  1.3*  --    GFR: Estimated Creatinine Clearance: 9.9 mL/min (A) (by C-G formula based on SCr of 8.86 mg/dL (H)). Liver Function Tests: Recent Labs  Lab 05/04/19 1826 05/05/19 0127  AST 24 22  ALT 23 21  ALKPHOS 61 42  BILITOT 2.1* 1.4*  PROT 7.3 5.5*  ALBUMIN 4.2 3.1*   Recent Labs  Lab 05/04/19 1826  LIPASE 23   No results for input(s): AMMONIA in the last 168 hours. Coagulation Profile: No results for input(s): INR, PROTIME in the last 168 hours. Cardiac Enzymes: Recent Labs  Lab 05/04/19 1826  CKTOTAL 107   BNP (last 3 results) No results for input(s): PROBNP in the last 8760 hours. HbA1C: No results for input(s): HGBA1C in the last 72 hours. CBG: Recent Labs  Lab 05/04/19 2308  GLUCAP 119*   Lipid Profile: No results for input(s): CHOL, HDL, LDLCALC, TRIG, CHOLHDL, LDLDIRECT in the last 72 hours. Thyroid Function Tests: Recent Labs    05/06/19 0441  TSH 2.812   Anemia Panel: No results for input(s): VITAMINB12, FOLATE, FERRITIN, TIBC, IRON, RETICCTPCT in the last 72 hours. Sepsis Labs: No results for input(s): PROCALCITON, LATICACIDVEN in the last 168 hours.  Recent Results (from the past 240 hour(s))  Respiratory Panel by RT PCR (Flu A&B, Covid) - Nasopharyngeal Swab     Status: None   Collection Time: 05/04/19  8:33 PM   Specimen: Nasopharyngeal Swab  Result Value Ref Range Status   SARS Coronavirus 2 by RT PCR NEGATIVE  NEGATIVE Final    Comment: (NOTE) SARS-CoV-2 target nucleic acids are NOT DETECTED. The SARS-CoV-2 RNA is generally detectable in upper respiratoy specimens during the acute phase of infection. The lowest concentration of SARS-CoV-2 viral copies this assay can detect is 131 copies/mL. A negative result does not preclude SARS-Cov-2 infection and should not be used as the sole basis for treatment or other patient management decisions. A negative result may occur with  improper specimen collection/handling, submission of specimen other than nasopharyngeal swab, presence of viral mutation(s) within the areas targeted by this assay, and inadequate number of viral copies (<131 copies/mL). A negative result must be combined with clinical observations, patient history, and epidemiological information. The expected result is Negative. Fact Sheet for Patients:  PinkCheek.be Fact Sheet for Healthcare Providers:  GravelBags.it This test is not yet ap proved or cleared by the [place] FDA and  has been authorized for detection and/or diagnosis of SARS-CoV-2 by FDA under an Emergency Use Authorization (EUA). This EUA will remain  in effect (meaning this test can be used) for the duration of the COVID-19 declaration under Section 564(b)(1) of the Act, 21 U.S.C. section 360bbb-3(b)(1), unless the authorization is terminated or revoked sooner.    Influenza A by PCR NEGATIVE NEGATIVE Final   Influenza B by PCR NEGATIVE NEGATIVE Final    Comment: (NOTE) The Xpert Xpress SARS-CoV-2/FLU/RSV assay is intended as an aid in  the diagnosis of influenza from Nasopharyngeal swab specimens and  should not be used as a sole basis for treatment. Nasal  washings and  aspirates are unacceptable for Xpert Xpress SARS-CoV-2/FLU/RSV  testing. Fact Sheet for Patients: PinkCheek.be Fact Sheet for Healthcare  Providers: GravelBags.it This test is not yet approved or cleared by the [place] FDA and  has been authorized for detection and/or diagnosis of SARS-CoV-2 by  FDA under an Emergency Use Authorization (EUA). This EUA will remain  in effect (meaning this test can be used) for the duration of the  Covid-19 declaration under Section 564(b)(1) of the Act, 21  U.S.C. section 360bbb-3(b)(1), unless the authorization is  terminated or revoked. Performed at [facility], [place] [address], [place], [place] [address]   Urine Culture     Status: Abnormal (Preliminary result)   Collection Time: 05/04/19 10:53 PM   Specimen: Urine, Cystoscope  Result Value Ref Range Status   Specimen Description   Final    URINE, CLEAN CATCH Performed at [facility], [place] [address] [place], [place] [address]    Special Requests   Final    CYSTOSCOPE Performed at [facility], [place] [address], [place], [place] [address]    Culture >=100,000 COLONIES/mL ESCHERICHIA COLI (A)  Final   Report Status PENDING  Incomplete  Culture, blood (routine x 2)     Status: None (Preliminary result)   Collection Time: 05/05/19  1:27 AM   Specimen: BLOOD  Result Value Ref Range Status   Specimen Description   Final    BLOOD RIGHT ANTECUBITAL Performed at [place] [address] [place], [place] [address]    Special Requests   Final    BOTTLES DRAWN AEROBIC AND ANAEROBIC Blood Culture adequate volume Performed at [place] [address], [place], [place] [address]    Culture   Final    NO GROWTH 1 DAY Performed at [facility], [place] [address] [place], [place] [address]    Report Status PENDING  Incomplete  Culture, blood (routine x 2)     Status: None (Preliminary result)   Collection Time: 05/05/19  1:27 AM   Specimen: BLOOD RIGHT HAND  Result Value Ref Range Status   Specimen  Description   Final    BLOOD RIGHT HAND Performed at [facility], [place] [address] [place], [place] [address]    Special Requests   Final    BOTTLES DRAWN AEROBIC ONLY Blood Culture adequate volume Performed at [place] [address], [place], [place] [address]    Culture   Final    NO GROWTH 1 DAY Performed at [facility], [place] [address] [place], [place] [address]    Report Status PENDING  Incomplete         Radiology Studies: DG Abd 1 View  Result Date: 05/05/2019 CLINICAL DATA:  Renal insufficiency right-sided stent EXAM: ABDOMEN - 1 VIEW COMPARISON:  CT 05/04/2019 FINDINGS: Mild gaseous prominence of upper small bowel without definitive obstruction. Interim placement of right-sided ureteral stent. No definitive radiopaque calculi. IMPRESSION: Placement of a right-sided ureteral stent. Nonobstructed bowel-gas pattern Electronically Signed   By: Donavan Foil M.D.   On: 05/05/2019 18:21   US RENAL  Result Date: 05/06/2019 CLINICAL DATA:  Acute kidney injury. History of left nephro ureterectomy. EXAM: RENAL / URINARY TRACT ULTRASOUND COMPLETE COMPARISON:  06/16/2018 FINDINGS: Right Kidney: Renal measurements: 13.7 x 6.4 x 7.3 cm = volume: 335 mL . Echogenicity within normal limits. No mass or hydronephrosis visualized. Left Kidney: Surgically absent.  Bladder: Decompressed by Foley catheter. Other: None. IMPRESSION: 1. Unremarkable right kidney.  Specifically, no hydronephrosis. 2. Left kidney surgically absent. Electronically Signed   By: Misty Stanley M.D.   On: 05/06/2019 08:50   DG CHEST PORT 1 VIEW  Result Date: 05/06/2019 CLINICAL DATA:  Shortness of breath EXAM: PORTABLE CHEST 1 VIEW COMPARISON:  Chest CT April 29, 2010 FINDINGS: There is consolidation in the medial right base region. There is subtle ill-defined opacity in each upper lobe. Lungs elsewhere clear. Heart size and pulmonary vascularity are normal. No adenopathy. No bone lesions.  IMPRESSION: Airspace opacity medial right base with more subtle opacity in each upper lobe. Appearance consistent with pneumonia, most notable in the medial right base. Cardiac silhouette within normal limits. No adenopathy. Electronically Signed   By: Lowella Grip III M.D.   On: 05/06/2019 12:40   ECHOCARDIOGRAM COMPLETE  Result Date: 05/06/2019    ECHOCARDIOGRAM REPORT   Patient Name:   Jorge Welch Date of Exam: 05/06/2019 Medical Rec #:  [id number]         Height:       66.0 in Accession #:    [id number]        Weight:       179.8 lb Date of Birth:  07-20-1970         BSA:          1.911 m Patient Age:    62 years          BP:           116/87 mmHg Patient Gender: M                 HR:           103 bpm. Exam Location:  Inpatient Procedure: 2D Echo Indications:    Abnormal ECG 794.31 / R94.31  History:        Patient has no prior history of Echocardiogram examinations.                 Aortic Valve Disease, Signs/Symptoms:Fever; Risk                 Factors:Diabetes and Hypertension. Tachycardia. Kidney Stones.                 Left nephrectomy. Acute ptelonephritis.  Sonographer:    Darlina Sicilian RDCS Referring Phys: [id number] [place]  1. Left ventricular ejection fraction, by estimation, is 60 to 65%. The left ventricle has normal function. The left ventricle has no regional wall motion abnormalities. There is mild concentric left ventricular hypertrophy. Indeterminate diastolic filling due to E-A fusion.  2. Right ventricular systolic function is normal. The right ventricular size is normal. There is normal pulmonary artery systolic pressure. The estimated right ventricular systolic pressure is 46.2 mmHg.  3. The mitral valve is grossly normal. Trivial mitral valve regurgitation. No evidence of mitral stenosis.  4. The aortic valve is tricuspid. Aortic valve regurgitation is not visualized. No aortic stenosis is present.  5. The inferior vena cava is normal in size with <50%  respiratory variability, suggesting right atrial pressure of 8 mmHg. FINDINGS  Left Ventricle: Left ventricular ejection fraction, by estimation, is 60 to 65%. The left ventricle has normal function. The left ventricle has no regional wall motion abnormalities. The left ventricular internal cavity size was normal in size. There is  mild concentric left ventricular hypertrophy. Indeterminate diastolic filling due to E-A fusion. Right Ventricle: The right ventricular size  is normal. No increase in right ventricular wall thickness. Right ventricular systolic function is normal. There is normal pulmonary artery systolic pressure. The tricuspid regurgitant velocity is 2.57 m/s, and  with an assumed right atrial pressure of 8 mmHg, the estimated right ventricular systolic pressure is 16.1 mmHg. Left Atrium: Left atrial size was normal in size. Right Atrium: Right atrial size was normal in size. Pericardium: There is no evidence of pericardial effusion. Presence of pericardial fat pad. Mitral Valve: The mitral valve is grossly normal. Trivial mitral valve regurgitation. No evidence of mitral valve stenosis. Tricuspid Valve: The tricuspid valve is grossly normal. Tricuspid valve regurgitation is trivial. No evidence of tricuspid stenosis. Aortic Valve: The aortic valve is tricuspid. Aortic valve regurgitation is not visualized. No aortic stenosis is present. Pulmonic Valve: The pulmonic valve was grossly normal. Pulmonic valve regurgitation is not visualized. No evidence of pulmonic stenosis. Aorta: The aortic root and ascending aorta are structurally normal, with no evidence of dilitation. Venous: The inferior vena cava is normal in size with less than 50% respiratory variability, suggesting right atrial pressure of 8 mmHg. IAS/Shunts: No atrial level shunt detected by color flow Doppler. EKG: Rhythm strip during this exam demostrated sinus tachycardia.  LEFT VENTRICLE PLAX 2D LVIDd:         3.90 cm LVIDs:         2.70 cm  LV PW:         1.30 cm LV IVS:        1.20 cm LVOT diam:     2.00 cm LV SV:         52 LV SV Index:   27 LVOT Area:     3.14 cm  RIGHT VENTRICLE RV S prime:     17.70 cm/s TAPSE (M-mode): 2.4 cm LEFT ATRIUM             Index       RIGHT ATRIUM          Index LA diam:        3.30 cm 1.73 cm/m  RA Area:     8.83 cm LA Vol (A2C):   58.8 ml 30.76 ml/m RA Volume:   15.50 ml 8.11 ml/m LA Vol (A4C):   33.6 ml 17.58 ml/m LA Biplane Vol: 46.3 ml 24.22 ml/m  AORTIC VALVE LVOT Vmax:   94.20 cm/s LVOT Vmean:  71.900 cm/s LVOT VTI:    0.167 m  AORTA Ao Root diam: 3.60 cm TRICUSPID VALVE TR Peak grad:   26.4 mmHg TR Vmax:        257.00 cm/s  SHUNTS Systemic VTI:  0.17 m Systemic Diam: 2.00 cm Eleonore Chiquito MD Electronically signed by Eleonore Chiquito MD Signature Date/Time: 05/06/2019/4:19:49 PM    Final         Scheduled Meds: . atorvastatin  20 mg Oral BH-q7a  . Chlorhexidine Gluconate Cloth  6 each Topical q morning - 10a  . doxycycline  100 mg Oral Q12H  . heparin  5,000 Units Subcutaneous Q8H  . metoprolol tartrate  5 mg Intravenous Once  . polyethylene glycol  17 g Oral Daily   Continuous Infusions: . sodium chloride 125 mL/hr at 05/06/19 1525  . cefTRIAXone (ROCEPHIN)  IV 2 g (05/06/19 2250)  . diltiazem (CARDIZEM) infusion 7.5 mg/hr (05/07/19 0304)     Time spent: 30 minutes with over 50% of the time coordinating the patient's care    Harold Hedge, DO [facility] Pager [phone]  Call night  coverage person covering after 7pm

## 2019-05-07 NOTE — Procedures (Signed)
  Procedure: R IJ Trialysis 20cm HD catheter placement   EBL:   minimal Complications:  none immediate  See full dictation in [facility].  Dillard Cannon MD Main # [phone] Pager  [phone]

## 2019-05-07 NOTE — Progress Notes (Signed)
[facility] Progress Note  Subjective: pt not in room  Vitals:   05/07/19 0745 05/07/19 1018 05/07/19 1201 05/07/19 1634  BP:  (!) 141/89 134/89 (!) 142/88  Pulse:  92 90 89  Resp:  (!) 21  16  Temp:  98.6 F (37 C) 98 F (36.7 C) 98.1 F (36.7 C)  TempSrc:  Oral Oral Oral  SpO2:  93% 94% 96%  Weight: 87.2 kg     Height:        Exam: Pt down for procedure, unable to examine   43M with solitary kidney presenting with obstructing distal ureteral stone with complication of pyelonephritis; now with AKI  Assessment/ Plan 1. AKI - BL SCr 1.5 w/ solitary kidney. Etiology including obstructive nephropathy+ pyelonephritis+ likely progression to ATN+ use of ACE inhibitor prior to admission. B/Cr cont to rise, will consult IR for HD access for HD tomorrow at [facility]. Please transfer to [facility] tonight.  2. Pyelonephritis, upstream of obstruction; GNR's on urine culture; blood cultures no growth to date; ceftriaxone 3. Distal ureteral stone, obstructing, status post ureteral stent; stone not yet extracted 4. 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 Hypertension on ACE inhibitor thiazide; meds held, blood pressure stable 6. DM2 on Metformin, medication held    Kelly Splinter 05/07/2019, 5:11 PM   Recent Labs  Lab 05/06/19 0441 05/07/19 0430  K 4.8 5.0  BUN 77* 96*  CREATININE 7.85* 8.86*  CALCIUM 7.7* 7.9*  HGB 11.6* 11.2*   Inpatient medications: . atorvastatin  20 mg Oral BH-q7a  . Chlorhexidine Gluconate Cloth  6 each Topical q morning - 10a  . [START ON 05/08/2019] Chlorhexidine Gluconate Cloth  6 each Topical Q0600  . diltiazem  120 mg Oral Daily  . doxycycline  100 mg Oral Q12H  . furosemide  80 mg Intravenous Q8H  . heparin  5,000 Units Subcutaneous Q8H  . lidocaine      . metoprolol tartrate  5 mg Intravenous Once  . polyethylene glycol  17 g Oral Daily   . cefTRIAXone (ROCEPHIN)  IV 2 g (05/06/19 2250)    acetaminophen **OR** acetaminophen, calcium carbonate (dosed in mg elemental calcium), camphor-menthol **AND** hydrOXYzine, docusate sodium, feeding supplement (NEPRO CARB STEADY), HYDROcodone-acetaminophen, lidocaine, ondansetron **OR** ondansetron (ZOFRAN) IV, senna, sorbitol

## 2019-05-07 NOTE — Progress Notes (Signed)
Patient transferred from [place] via [facility]. Patient is alert and oriented x 4. Denies frank pain but admits belly discomfort and tightness. Patient oriented to stafff and room. Call bell within reach. Bed in lowest position. Will continue to monitor. Dorthey Sawyer, RN

## 2019-05-07 NOTE — Progress Notes (Signed)
Cardizem stop. Fluids D/C. Plan of care update with pt. Pt. In agreement. with plan. Report cal to 63M Nurse Tammy, RN.

## 2019-05-07 NOTE — Plan of Care (Signed)
  Problem: Safety: Goal: Ability to remain free from injury will improve Outcome: Progressing   Problem: Skin Integrity: Goal: Risk for impaired skin integrity will decrease Outcome: Progressing   

## 2019-05-08 LAB — COMPREHENSIVE METABOLIC PANEL
ALT: 20 U/L (ref 0–44)
AST: 18 U/L (ref 15–41)
Albumin: 2.2 g/dL — ABNORMAL LOW (ref 3.5–5.0)
Alkaline Phosphatase: 89 U/L (ref 38–126)
Anion gap: 15 (ref 5–15)
BUN: 98 mg/dL — ABNORMAL HIGH (ref 6–20)
CO2: 16 mmol/L — ABNORMAL LOW (ref 22–32)
Calcium: 8.6 mg/dL — ABNORMAL LOW (ref 8.9–10.3)
Chloride: 103 mmol/L (ref 98–111)
Creatinine, Ser: 9.08 mg/dL — ABNORMAL HIGH (ref 0.61–1.24)
GFR calc Af Amer: 7 mL/min — ABNORMAL LOW (ref >60)
GFR calc non Af Amer: 6 mL/min — ABNORMAL LOW (ref >60)
Glucose, Bld: 154 mg/dL — ABNORMAL HIGH (ref 70–99)
Potassium: 4.3 mmol/L (ref 3.5–5.1)
Sodium: 134 mmol/L — ABNORMAL LOW (ref 135–145)
Total Bilirubin: 0.6 mg/dL (ref 0.3–1.2)
Total Protein: 5.3 g/dL — ABNORMAL LOW (ref 6.5–8.1)

## 2019-05-08 LAB — CBC
HCT: 33.9 % — ABNORMAL LOW (ref 39.0–52.0)
Hemoglobin: 11.4 g/dL — ABNORMAL LOW (ref 13.0–17.0)
MCH: 30.7 pg (ref 26.0–34.0)
MCHC: 33.6 g/dL (ref 30.0–36.0)
MCV: 91.4 fL (ref 80.0–100.0)
Platelets: 123 10*3/uL — ABNORMAL LOW (ref 150–400)
RBC: 3.71 MIL/uL — ABNORMAL LOW (ref 4.22–5.81)
RDW: 13 % (ref 11.5–15.5)
WBC: 7.9 10*3/uL (ref 4.0–10.5)
nRBC: 0 % (ref 0.0–0.2)

## 2019-05-08 LAB — GLUCOSE, CAPILLARY: Glucose-Capillary: 133 mg/dL — ABNORMAL HIGH (ref 70–99)

## 2019-05-08 NOTE — Progress Notes (Signed)
[facility] Progress Note  Subjective: pt seen in room, good response to IV lasix  Vitals:   05/07/19 2053 05/08/19 0435 05/08/19 0500 05/08/19 0924  BP: (!) 155/91 (!) 142/86  (!) 146/92  Pulse: 93 83  87  Resp: 20 16  18   Temp: 97.8 F (36.6 C) 98.6 F (37 C)  98 F (36.7 C)  TempSrc: Oral Oral  Oral  SpO2: 97% 95%  94%  Weight:   89.7 kg   Height:        Exam: R IJ temp cath   Alert, no distress, lying flat   No jvd  Chest occ rales, no wheezing, no ^wob   Cor reg no RG   Abd soft ntnd no ascites    Ext 1+ pretib edema    NF Ox 3, no asterixis   50M with solitary kidney presenting with obstructing R distal ureteral stone with complication of pyelonephritis; now with AKI  Assessment/ Plan 1. AKI - BL SCr 1.5 w/ solitary kidney. Etiology including obstructive nephropathy+ pyelonephritis+ use of ACE inhibitor prior to admission. B/Cr high, planning for HD x 1 today, then will watch to look for signs of recovery. As pt was clearly obstructed should have high chance of recovery. Have d/w pt.  2. Pyelonephritis- w/ ureteral obstruction d/t stone.  EColi by UCx , Bcx's negative, on IV abx 3. Distal ureteral stone - status post ureteral stent on 3/16. For stone extraction at a later date.  4. 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 HTN/ - hold ACEi/ thiazide for now w/ AKI, BP's okay, vol up mild-mod 6. DM2 - Metformin no hold    Rob Everlee Quakenbush 05/08/2019, 12:05 PM   Recent Labs  Lab 05/07/19 0430 05/08/19 0449  K 5.0 4.3  BUN 96* 98*  CREATININE 8.86* 9.08*  CALCIUM 7.9* 8.6*  HGB 11.2* 11.4*   Inpatient medications: . atorvastatin  20 mg Oral BH-q7a  . Chlorhexidine Gluconate Cloth  6 each Topical Q0600  . diltiazem  120 mg Oral Daily  . doxycycline  100 mg Oral Q12H  . furosemide  80 mg Intravenous Q8H  . heparin  5,000 Units Subcutaneous Q8H  . metoprolol tartrate  5 mg Intravenous Once  . polyethylene glycol   17 g Oral Daily  . sodium chloride flush  10-40 mL Intracatheter Q12H   . cefTRIAXone (ROCEPHIN)  IV 2 g (05/07/19 2122)   acetaminophen **OR** acetaminophen, albuterol, calcium carbonate (dosed in mg elemental calcium), camphor-menthol **AND** hydrOXYzine, docusate sodium, feeding supplement (NEPRO CARB STEADY), HYDROcodone-acetaminophen, lidocaine, ondansetron **OR** ondansetron (ZOFRAN) IV, senna, sodium chloride flush, sorbitol

## 2019-05-08 NOTE — Progress Notes (Signed)
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  Urology was consulted and patient was taken to the OR on 3/16 evening for right cystoscopy with right ureteral stent placement and urethral dilation.  3/18: A. fib with RVR started on Cardizem drip.  Renal function worsened with creatinine up to 7.  Nephrology consulted.  Hypoxic, chest x-ray showing concern for pneumonia, neph optimisitc for renal recovery   Assessment & Plan:   Principal Problem:   Acute renal failure (ARF) ([place]) Active Problems:   Diabetes ([place])   Essential hypertension   Hyperlipidemia   Acute pyelonephritis   1. AKI secondary to obstructive uropathy, s/p cystoscopy with right ureteral stent placement and urethral dilation (05/04/19, Dr. Gloriann Loan) a. Monitoring output b. Monitor lytes c. neph on board 2. Sepsis secondary to E. coli UTI, concern for developing pneumonia sepsis now resolved a. Continue Rocephin, day 5/7, Doxycycline Day 3/5 (added on 3/18) b. Blood cultures no growth to date c. Urine cult sensitive to ceftriax 3. Acute hypoxic respiratory failure, concern for developing CAP a. Day 4 Rocephin, day 3 Doxy.  Hold off on azithromycin given arrhythmia b. Responded well to lasix  4. A. fib with RVR Likely secondary to sepsis a. CHA2DS2-VASc: At least 2 (hypertension, diabetes) however off heparin drip given recent urologic procedure and gross hematuria for now b. Off dilt gtt, on cardizem cd 120 - controlled 5. Hypomagnesemia replete as needed 6. Recurrent nephrolithiasis Plan per  urology 7. Hypertension Stable off meds 8. Hyperlipidemia cont home Lipitor on d/c 9. Diabetes Continue sliding scale  DVT prophylaxis: Heparin SQ  Code Status: full    Code Status Orders  (From admission, onward)         Start     Ordered   05/05/19 0054  Full code  Continuous     05/05/19 0053        Code Status History    Date Active Date Inactive Code Status Order ID Comments User Context   07/23/2018 2033 07/24/2018 1722 Full Code [id number]  Debbrah Alar, PA-C Inpatient   Advance Care Planning Activity     Family Communication: Cussed in detail with patient at bedside today Disposition Plan:   Disposition pending nephrology and renal recovery Consults called: None Admission status: Inpatient   Consultants:   Nephrology, urology  Procedures:  CT ABDOMEN PELVIS WO CONTRAST  Result Date: 05/04/2019 CLINICAL DATA:  Right flank pain EXAM: CT ABDOMEN AND PELVIS WITHOUT CONTRAST TECHNIQUE: Multidetector CT imaging of the abdomen and pelvis was performed following the standard protocol without IV contrast. COMPARISON:  None. FINDINGS: LOWER CHEST: Normal. HEPATOBILIARY: Normal hepatic contours. No intra- or extrahepatic biliary dilatation. The gallbladder is normal. PANCREAS: Normal pancreas. No ductal dilatation or peripancreatic fluid collection. SPLEEN: Normal. ADRENALS/URINARY TRACT: The adrenal glands are normal. There is a stone within the distal right ureter measuring 4 mm, causing mild hydronephrosis and moderate hydroureter and mild perinephric stranding. Left kidney is surgically absent. No abnormality of the left renal fossa. The urinary bladder is normal for degree of distention STOMACH/BOWEL: There is no hiatal hernia. Normal duodenal course and caliber. No small  bowel dilatation or inflammation. No focal colonic abnormality. Normal appendix. VASCULAR/LYMPHATIC: Normal course and caliber of the major abdominal vessels. No abdominal or pelvic lymphadenopathy.  REPRODUCTIVE: Normal prostate size with symmetric seminal vesicles. MUSCULOSKELETAL. No bony spinal canal stenosis or focal osseous abnormality. OTHER: None. IMPRESSION: 1. Right obstructive uropathy with 4 mm stone in the distal right ureter causing mild hydronephrosis and moderate hydroureter. 2. Status post left nephrectomy. Electronically Signed   By: Ulyses Jarred M.D.   On: 05/04/2019 20:05   DG Abd 1 View  Result Date: 05/05/2019 CLINICAL DATA:  Renal insufficiency right-sided stent EXAM: ABDOMEN - 1 VIEW COMPARISON:  CT 05/04/2019 FINDINGS: Mild gaseous prominence of upper small bowel without definitive obstruction. Interim placement of right-sided ureteral stent. No definitive radiopaque calculi. IMPRESSION: Placement of a right-sided ureteral stent. Nonobstructed bowel-gas pattern Electronically Signed   By: Donavan Foil M.D.   On: 05/05/2019 18:21   US RENAL  Result Date: 05/06/2019 CLINICAL DATA:  Acute kidney injury. History of left nephro ureterectomy. EXAM: RENAL / URINARY TRACT ULTRASOUND COMPLETE COMPARISON:  06/16/2018 FINDINGS: Right Kidney: Renal measurements: 13.7 x 6.4 x 7.3 cm = volume: 335 mL . Echogenicity within normal limits. No mass or hydronephrosis visualized. Left Kidney: Surgically absent. Bladder: Decompressed by Foley catheter. Other: None. IMPRESSION: 1. Unremarkable right kidney.  Specifically, no hydronephrosis. 2. Left kidney surgically absent. Electronically Signed   By: Misty Stanley M.D.   On: 05/06/2019 08:50   IR Fluoro Guide CV Line Right  Result Date: 05/07/2019 CLINICAL DATA:  Acute renal failure, needs access for planned dialysis EXAM: EXAM RIGHT IJ CATHETER PLACEMENT UNDER ULTRASOUND AND FLUOROSCOPIC GUIDANCE TECHNIQUE: The procedure, risks (including but not limited to bleeding, infection, organ damage, pneumothorax), benefits, and alternatives were explained to the patient. Questions regarding the procedure were encouraged and answered. The patient  understands and consents to the procedure. Patency of the right IJ vein was confirmed with ultrasound with image documentation. An appropriate skin site was determined. Skin site was marked. Region was prepped using maximum barrier technique including cap and mask, sterile gown, sterile gloves, large sterile sheet, and Chlorhexidine as cutaneous antisepsis. The region was infiltrated locally with 1% lidocaine. Under real-time ultrasound guidance, the right IJ vein was accessed with a 19 gauge needle; the needle tip within the vein was confirmed with ultrasound image documentation. The needle exchanged over a guidewire for vascular dilator which allowed advancement of a 20 cm Trialysis catheter. This was positioned with the tip at the cavoatrial junction. Spot chest radiograph shows good positioning and no pneumothorax. Catheter was flushed and sutured externally with 0-Prolene sutures. Patient tolerated the procedure well. FLUOROSCOPY TIME:  0.1 minute COMPLICATIONS: COMPLICATIONS none IMPRESSION: 1. Technically successful right IJ Trialysis catheter placement. Electronically Signed   By: Lucrezia Europe M.D.   On: 05/07/2019 16:26   IR US Guide Vasc Access Right  Result Date: 05/07/2019 CLINICAL DATA:  Acute renal failure, needs access for planned dialysis EXAM: EXAM RIGHT IJ CATHETER PLACEMENT UNDER ULTRASOUND AND FLUOROSCOPIC GUIDANCE TECHNIQUE: The procedure, risks (including but not limited to bleeding, infection, organ damage, pneumothorax), benefits, and alternatives were explained to the patient. Questions regarding the procedure were encouraged and answered. The patient understands and consents to the procedure. Patency of the right IJ vein was confirmed with ultrasound with image documentation. An appropriate skin site was determined. Skin site was marked. Region was prepped using maximum barrier technique including cap and mask, sterile gown, sterile gloves, large sterile sheet, and Chlorhexidine  as  cutaneous antisepsis. The region was infiltrated locally with 1% lidocaine. Under real-time ultrasound guidance, the right IJ vein was accessed with a 19 gauge needle; the needle tip within the vein was confirmed with ultrasound image documentation. The needle exchanged over a guidewire for vascular dilator which allowed advancement of a 20 cm Trialysis catheter. This was positioned with the tip at the cavoatrial junction. Spot chest radiograph shows good positioning and no pneumothorax. Catheter was flushed and sutured externally with 0-Prolene sutures. Patient tolerated the procedure well. FLUOROSCOPY TIME:  0.1 minute COMPLICATIONS: COMPLICATIONS none IMPRESSION: 1. Technically successful right IJ Trialysis catheter placement. Electronically Signed   By: Lucrezia Europe M.D.   On: 05/07/2019 16:26   DG CHEST PORT 1 VIEW  Result Date: 05/06/2019 CLINICAL DATA:  Shortness of breath EXAM: PORTABLE CHEST 1 VIEW COMPARISON:  Chest CT April 29, 2010 FINDINGS: There is consolidation in the medial right base region. There is subtle ill-defined opacity in each upper lobe. Lungs elsewhere clear. Heart size and pulmonary vascularity are normal. No adenopathy. No bone lesions. IMPRESSION: Airspace opacity medial right base with more subtle opacity in each upper lobe. Appearance consistent with pneumonia, most notable in the medial right base. Cardiac silhouette within normal limits. No adenopathy. Electronically Signed   By: Lowella Grip III M.D.   On: 05/06/2019 12:40   DG C-Arm 1-60 Min-No Report  Result Date: 05/04/2019 Fluoroscopy was utilized by the requesting physician.  No radiographic interpretation.   ECHOCARDIOGRAM COMPLETE  Result Date: 05/06/2019    ECHOCARDIOGRAM REPORT   Patient Name:   SHAMOND SKELTON Date of Exam: 05/06/2019 Medical Rec #:  [id number]         Height:       66.0 in Accession #:    [id number]        Weight:       179.8 lb Date of Birth:  1970/06/21         BSA:          1.911 m  Patient Age:    54 years          BP:           116/87 mmHg Patient Gender: M                 HR:           103 bpm. Exam Location:  Inpatient Procedure: 2D Echo Indications:    Abnormal ECG 794.31 / R94.31  History:        Patient has no prior history of Echocardiogram examinations.                 Aortic Valve Disease, Signs/Symptoms:Fever; Risk                 Factors:Diabetes and Hypertension. Tachycardia. Kidney Stones.                 Left nephrectomy. Acute ptelonephritis.  Sonographer:    Darlina Sicilian RDCS Referring Phys: [id number] [place]  1. Left ventricular ejection fraction, by estimation, is 60 to 65%. The left ventricle has normal function. The left ventricle has no regional wall motion abnormalities. There is mild concentric left ventricular hypertrophy. Indeterminate diastolic filling due to E-A fusion.  2. Right ventricular systolic function is normal. The right ventricular size is normal. There is normal pulmonary artery systolic pressure. The estimated right ventricular systolic pressure is 78.9 mmHg.  3. The mitral valve is grossly normal. Trivial  mitral valve regurgitation. No evidence of mitral stenosis.  4. The aortic valve is tricuspid. Aortic valve regurgitation is not visualized. No aortic stenosis is present.  5. The inferior vena cava is normal in size with <50% respiratory variability, suggesting right atrial pressure of 8 mmHg. FINDINGS  Left Ventricle: Left ventricular ejection fraction, by estimation, is 60 to 65%. The left ventricle has normal function. The left ventricle has no regional wall motion abnormalities. The left ventricular internal cavity size was normal in size. There is  mild concentric left ventricular hypertrophy. Indeterminate diastolic filling due to E-A fusion. Right Ventricle: The right ventricular size is normal. No increase in right ventricular wall thickness. Right ventricular systolic function is normal. There is normal pulmonary artery  systolic pressure. The tricuspid regurgitant velocity is 2.57 m/s, and  with an assumed right atrial pressure of 8 mmHg, the estimated right ventricular systolic pressure is 35.0 mmHg. Left Atrium: Left atrial size was normal in size. Right Atrium: Right atrial size was normal in size. Pericardium: There is no evidence of pericardial effusion. Presence of pericardial fat pad. Mitral Valve: The mitral valve is grossly normal. Trivial mitral valve regurgitation. No evidence of mitral valve stenosis. Tricuspid Valve: The tricuspid valve is grossly normal. Tricuspid valve regurgitation is trivial. No evidence of tricuspid stenosis. Aortic Valve: The aortic valve is tricuspid. Aortic valve regurgitation is not visualized. No aortic stenosis is present. Pulmonic Valve: The pulmonic valve was grossly normal. Pulmonic valve regurgitation is not visualized. No evidence of pulmonic stenosis. Aorta: The aortic root and ascending aorta are structurally normal, with no evidence of dilitation. Venous: The inferior vena cava is normal in size with less than 50% respiratory variability, suggesting right atrial pressure of 8 mmHg. IAS/Shunts: No atrial level shunt detected by color flow Doppler. EKG: Rhythm strip during this exam demostrated sinus tachycardia.  LEFT VENTRICLE PLAX 2D LVIDd:         3.90 cm LVIDs:         2.70 cm LV PW:         1.30 cm LV IVS:        1.20 cm LVOT diam:     2.00 cm LV SV:         52 LV SV Index:   27 LVOT Area:     3.14 cm  RIGHT VENTRICLE RV S prime:     17.70 cm/s TAPSE (M-mode): 2.4 cm LEFT ATRIUM             Index       RIGHT ATRIUM          Index LA diam:        3.30 cm 1.73 cm/m  RA Area:     8.83 cm LA Vol (A2C):   58.8 ml 30.76 ml/m RA Volume:   15.50 ml 8.11 ml/m LA Vol (A4C):   33.6 ml 17.58 ml/m LA Biplane Vol: 46.3 ml 24.22 ml/m  AORTIC VALVE LVOT Vmax:   94.20 cm/s LVOT Vmean:  71.900 cm/s LVOT VTI:    0.167 m  AORTA Ao Root diam: 3.60 cm TRICUSPID VALVE TR Peak grad:   26.4 mmHg  TR Vmax:        257.00 cm/s  SHUNTS Systemic VTI:  0.17 m Systemic Diam: 2.00 cm Eleonore Chiquito MD Electronically signed by Eleonore Chiquito MD Signature Date/Time: 05/06/2019/4:19:49 PM    Final      Antimicrobials:   Ceftriaxone day 5 of 7, doxy day 3 of 5  Subjective: No acute events overnight, hopeful for recovery  Objective: Vitals:   05/07/19 2053 05/08/19 0435 05/08/19 0500 05/08/19 0924  BP: (!) 155/91 (!) 142/86  (!) 146/92  Pulse: 93 83  87  Resp: 20 16  18   Temp: 97.8 F (36.6 C) 98.6 F (37 C)  98 F (36.7 C)  TempSrc: Oral Oral  Oral  SpO2: 97% 95%  94%  Weight:   89.7 kg   Height:        Intake/Output Summary (Last 24 hours) at 05/08/2019 1409 Last data filed at 05/08/2019 1300 Gross per 24 hour  Intake 1390.07 ml  Output 2675 ml  Net -1284.93 ml   Filed Weights   05/07/19 1843 05/07/19 2031 05/08/19 0500  Weight: 87.8 kg 89.7 kg 89.7 kg    Examination:  General exam: Appears calm and comfortable  Respiratory system: Clear to auscultation. Respiratory effort normal. Cardiovascular system: S1 & S2 heard, RRR. No JVD, murmurs, rubs, gallops or clicks. No pedal edema. Gastrointestinal system: Abdomen is nondistended, soft and nontender. No organomegaly or masses felt. Normal bowel sounds heard. Central nervous system: Alert and oriented. No focal neurological deficits. Extremities: Mild edema bilaterally lower extremities. Skin: No rashes, lesions or ulcers Psychiatry: Judgement and insight appear normal. Mood & affect appropriate.     Data Reviewed: I have personally reviewed following labs and imaging studies  CBC: Recent Labs  Lab 05/04/19 1827 05/05/19 0127 05/06/19 0441 05/07/19 0430 05/08/19 0449  WBC 17.9* 9.4 11.4* 8.7 7.9  NEUTROABS 16.5*  --   --   --   --   HGB 14.7 11.9* 11.6* 11.2* 11.4*  HCT 41.8 35.0* 34.4* 33.3* 33.9*  MCV 91.7 92.6 94.0 94.6 91.4  PLT 160 116* 113* 120* 440*   Basic Metabolic Panel: Recent Labs  Lab  05/04/19 1826 05/05/19 0127 05/06/19 0441 05/06/19 1618 05/07/19 0430 05/07/19 0434 05/08/19 0449  NA 139 137 136  --  133*  --  134*  K 5.6* 5.1 4.8  --  5.0  --  4.3  CL 102 107 106  --  104  --  103  CO2 26 20* 18*  --  16*  --  16*  GLUCOSE 182* 144* 118*  --  113*  --  154*  BUN 43* 45* 77*  --  96*  --  98*  CREATININE 5.28* 5.07* 7.85*  --  8.86*  --  9.08*  CALCIUM 9.7 7.7* 7.7*  --  7.9*  --  8.6*  MG  --   --   --  1.3*  --  2.4  --    GFR: Estimated Creatinine Clearance: 10.1 mL/min (A) (by C-G formula based on SCr of 9.08 mg/dL (H)). Liver Function Tests: Recent Labs  Lab 05/04/19 1826 05/05/19 0127 05/08/19 0449  AST 24 22 18   ALT 23 21 20   ALKPHOS 61 42 89  BILITOT 2.1* 1.4* 0.6  PROT 7.3 5.5* 5.3*  ALBUMIN 4.2 3.1* 2.2*   Recent Labs  Lab 05/04/19 1826  LIPASE 23   No results for input(s): AMMONIA in the last 168 hours. Coagulation Profile: No results for input(s): INR, PROTIME in the last 168 hours. Cardiac Enzymes: Recent Labs  Lab 05/04/19 1826  CKTOTAL 107   BNP (last 3 results) No results for input(s): PROBNP in the last 8760 hours. HbA1C: No results for input(s): HGBA1C in the last 72 hours. CBG: Recent Labs  Lab 05/04/19 2308  GLUCAP 119*   Lipid Profile: No  results for input(s): CHOL, HDL, LDLCALC, TRIG, CHOLHDL, LDLDIRECT in the last 72 hours. Thyroid Function Tests: Recent Labs    05/06/19 0441  TSH 2.812   Anemia Panel: No results for input(s): VITAMINB12, FOLATE, FERRITIN, TIBC, IRON, RETICCTPCT in the last 72 hours. Sepsis Labs: No results for input(s): PROCALCITON, LATICACIDVEN in the last 168 hours.  Recent Results (from the past 240 hour(s))  Respiratory Panel by RT PCR (Flu A&B, Covid) - Nasopharyngeal Swab     Status: None   Collection Time: 05/04/19  8:33 PM   Specimen: Nasopharyngeal Swab  Result Value Ref Range Status   SARS Coronavirus 2 by RT PCR NEGATIVE NEGATIVE Final    Comment: (NOTE) SARS-CoV-2  target nucleic acids are NOT DETECTED. The SARS-CoV-2 RNA is generally detectable in upper respiratoy specimens during the acute phase of infection. The lowest concentration of SARS-CoV-2 viral copies this assay can detect is 131 copies/mL. A negative result does not preclude SARS-Cov-2 infection and should not be used as the sole basis for treatment or other patient management decisions. A negative result may occur with  improper specimen collection/handling, submission of specimen other than nasopharyngeal swab, presence of viral mutation(s) within the areas targeted by this assay, and inadequate number of viral copies (<131 copies/mL). A negative result must be combined with clinical observations, patient history, and epidemiological information. The expected result is Negative. Fact Sheet for Patients:  PinkCheek.be Fact Sheet for Healthcare Providers:  GravelBags.it This test is not yet ap proved or cleared by the [place] FDA and  has been authorized for detection and/or diagnosis of SARS-CoV-2 by FDA under an Emergency Use Authorization (EUA). This EUA will remain  in effect (meaning this test can be used) for the duration of the COVID-19 declaration under Section 564(b)(1) of the Act, 21 U.S.C. section 360bbb-3(b)(1), unless the authorization is terminated or revoked sooner.    Influenza A by PCR NEGATIVE NEGATIVE Final   Influenza B by PCR NEGATIVE NEGATIVE Final    Comment: (NOTE) The Xpert Xpress SARS-CoV-2/FLU/RSV assay is intended as an aid in  the diagnosis of influenza from Nasopharyngeal swab specimens and  should not be used as a sole basis for treatment. Nasal washings and  aspirates are unacceptable for Xpert Xpress SARS-CoV-2/FLU/RSV  testing. Fact Sheet for Patients: PinkCheek.be Fact Sheet for Healthcare Providers: GravelBags.it This test is  not yet approved or cleared by the [place] FDA and  has been authorized for detection and/or diagnosis of SARS-CoV-2 by  FDA under an Emergency Use Authorization (EUA). This EUA will remain  in effect (meaning this test can be used) for the duration of the  Covid-19 declaration under Section 564(b)(1) of the Act, 21  U.S.C. section 360bbb-3(b)(1), unless the authorization is  terminated or revoked. Performed at [facility], [place] [address] [place], [place] [address]   Urine Culture     Status: Abnormal   Collection Time: 05/04/19 10:53 PM   Specimen: Urine, Cystoscope  Result Value Ref Range Status   Specimen Description   Final    URINE, CLEAN CATCH Performed at [facility], [place] [address], [place], [place] [address]    Special Requests   Final    CYSTOSCOPE Performed at [facility], [place] [address], [place], [place] [address]    Culture >=100,000 COLONIES/mL ESCHERICHIA COLI (A)  Final   Report Status 05/07/2019 FINAL  Final   Organism ID, Bacteria ESCHERICHIA COLI (A)  Final  Susceptibility   Escherichia coli - MIC*    AMPICILLIN <=2 SENSITIVE Sensitive     CEFAZOLIN <=4 SENSITIVE Sensitive     CEFTRIAXONE <=0.25 SENSITIVE Sensitive     CIPROFLOXACIN <=0.25 SENSITIVE Sensitive     GENTAMICIN <=1 SENSITIVE Sensitive     IMIPENEM <=0.25 SENSITIVE Sensitive     NITROFURANTOIN <=16 SENSITIVE Sensitive     TRIMETH/SULFA <=20 SENSITIVE Sensitive     AMPICILLIN/SULBACTAM <=2 SENSITIVE Sensitive     PIP/TAZO <=4 SENSITIVE Sensitive     * >=100,000 COLONIES/mL ESCHERICHIA COLI  Culture, blood (routine x 2)     Status: None (Preliminary result)   Collection Time: 05/05/19  1:27 AM   Specimen: BLOOD  Result Value Ref Range Status   Specimen Description   Final    BLOOD RIGHT ANTECUBITAL Performed at [place] [address] [place], [place] [address]    Special Requests   Final     BOTTLES DRAWN AEROBIC AND ANAEROBIC Blood Culture adequate volume Performed at [place] [address] [place], [place] [address]    Culture   Final    NO GROWTH 3 DAYS Performed at [facility], [place] [address] [place], [place] [address]    Report Status PENDING  Incomplete  Culture, blood (routine x 2)     Status: None (Preliminary result)   Collection Time: 05/05/19  1:27 AM   Specimen: BLOOD RIGHT HAND  Result Value Ref Range Status   Specimen Description   Final    BLOOD RIGHT HAND Performed at [facility], [place] [address] [place], [place] [address]    Special Requests   Final    BOTTLES DRAWN AEROBIC ONLY Blood Culture adequate volume Performed at [place] [address] [place], [place] [address]    Culture   Final    NO GROWTH 3 DAYS Performed at [facility], [place] [address], [place],  [address]    Report Status PENDING  Incomplete         Radiology Studies: IR Fluoro Guide CV Line Right  Result Date: 05/07/2019 CLINICAL DATA:  Acute renal failure, needs access for planned dialysis EXAM: EXAM RIGHT IJ CATHETER PLACEMENT UNDER ULTRASOUND AND FLUOROSCOPIC GUIDANCE TECHNIQUE: The procedure, risks (including but not limited to bleeding, infection, organ damage, pneumothorax), benefits, and alternatives were explained to the patient. Questions regarding the procedure were encouraged and answered. The patient understands and consents to the procedure. Patency of the right IJ vein was confirmed with ultrasound with image documentation. An appropriate skin site was determined. Skin site was marked. Region was prepped using maximum barrier technique including cap and mask, sterile gown, sterile gloves, large sterile sheet, and Chlorhexidine as cutaneous antisepsis. The region was infiltrated locally with 1% lidocaine. Under real-time ultrasound guidance, the right IJ vein was accessed with a 19 gauge needle;  the needle tip within the vein was confirmed with ultrasound image documentation. The needle exchanged over a guidewire for vascular dilator which allowed advancement of a 20 cm Trialysis catheter. This was positioned with the tip at the cavoatrial junction. Spot chest radiograph shows good positioning and no pneumothorax. Catheter was flushed and sutured externally with 0-Prolene sutures. Patient tolerated the procedure well. FLUOROSCOPY TIME:  0.1 minute COMPLICATIONS: COMPLICATIONS none IMPRESSION: 1. Technically successful right IJ Trialysis catheter placement. Electronically Signed   By: Lucrezia Europe M.D.   On: 05/07/2019 16:26   IR US Guide Vasc Access  Right  Result Date: 05/07/2019 CLINICAL DATA:  Acute renal failure, needs access for planned dialysis EXAM: EXAM RIGHT IJ CATHETER PLACEMENT UNDER ULTRASOUND AND FLUOROSCOPIC GUIDANCE TECHNIQUE: The procedure, risks (including but not limited to bleeding, infection, organ damage, pneumothorax), benefits, and alternatives were explained to the patient. Questions regarding the procedure were encouraged and answered. The patient understands and consents to the procedure. Patency of the right IJ vein was confirmed with ultrasound with image documentation. An appropriate skin site was determined. Skin site was marked. Region was prepped using maximum barrier technique including cap and mask, sterile gown, sterile gloves, large sterile sheet, and Chlorhexidine as cutaneous antisepsis. The region was infiltrated locally with 1% lidocaine. Under real-time ultrasound guidance, the right IJ vein was accessed with a 19 gauge needle; the needle tip within the vein was confirmed with ultrasound image documentation. The needle exchanged over a guidewire for vascular dilator which allowed advancement of a 20 cm Trialysis catheter. This was positioned with the tip at the cavoatrial junction. Spot chest radiograph shows good positioning and no pneumothorax. Catheter was  flushed and sutured externally with 0-Prolene sutures. Patient tolerated the procedure well. FLUOROSCOPY TIME:  0.1 minute COMPLICATIONS: COMPLICATIONS none IMPRESSION: 1. Technically successful right IJ Trialysis catheter placement. Electronically Signed   By: Lucrezia Europe M.D.   On: 05/07/2019 16:26   ECHOCARDIOGRAM COMPLETE  Result Date: 05/06/2019    ECHOCARDIOGRAM REPORT   Patient Name:   AQIB LOUGH Date of Exam: 05/06/2019 Medical Rec #:  [id number]         Height:       66.0 in Accession #:    [id number]        Weight:       179.8 lb Date of Birth:  [birth year]         BSA:          1.911 m Patient Age:    52 years          BP:           116/87 mmHg Patient Gender: M                 HR:           103 bpm. Exam Location:  Inpatient Procedure: 2D Echo Indications:    Abnormal ECG 794.31 / R94.31  History:        Patient has no prior history of Echocardiogram examinations.                 Aortic Valve Disease, Signs/Symptoms:Fever; Risk                 Factors:Diabetes and Hypertension. Tachycardia. Kidney Stones.                 Left nephrectomy. Acute ptelonephritis.  Sonographer:    Darlina Sicilian RDCS Referring Phys: [id number] Nelson  1. Left ventricular ejection fraction, by estimation, is 60 to 65%. The left ventricle has normal function. The left ventricle has no regional wall motion abnormalities. There is mild concentric left ventricular hypertrophy. Indeterminate diastolic filling due to E-A fusion.  2. Right ventricular systolic function is normal. The right ventricular size is normal. There is normal pulmonary artery systolic pressure. The estimated right ventricular systolic pressure is 92.1 mmHg.  3. The mitral valve is grossly normal. Trivial mitral valve regurgitation. No evidence of mitral stenosis.  4. The aortic valve is tricuspid. Aortic valve regurgitation is not visualized. No aortic stenosis is  present.  5. The inferior vena cava is normal in size with <50%  respiratory variability, suggesting right atrial pressure of 8 mmHg. FINDINGS  Left Ventricle: Left ventricular ejection fraction, by estimation, is 60 to 65%. The left ventricle has normal function. The left ventricle has no regional wall motion abnormalities. The left ventricular internal cavity size was normal in size. There is  mild concentric left ventricular hypertrophy. Indeterminate diastolic filling due to E-A fusion. Right Ventricle: The right ventricular size is normal. No increase in right ventricular wall thickness. Right ventricular systolic function is normal. There is normal pulmonary artery systolic pressure. The tricuspid regurgitant velocity is 2.57 m/s, and  with an assumed right atrial pressure of 8 mmHg, the estimated right ventricular systolic pressure is 89.3 mmHg. Left Atrium: Left atrial size was normal in size. Right Atrium: Right atrial size was normal in size. Pericardium: There is no evidence of pericardial effusion. Presence of pericardial fat pad. Mitral Valve: The mitral valve is grossly normal. Trivial mitral valve regurgitation. No evidence of mitral valve stenosis. Tricuspid Valve: The tricuspid valve is grossly normal. Tricuspid valve regurgitation is trivial. No evidence of tricuspid stenosis. Aortic Valve: The aortic valve is tricuspid. Aortic valve regurgitation is not visualized. No aortic stenosis is present. Pulmonic Valve: The pulmonic valve was grossly normal. Pulmonic valve regurgitation is not visualized. No evidence of pulmonic stenosis. Aorta: The aortic root and ascending aorta are structurally normal, with no evidence of dilitation. Venous: The inferior vena cava is normal in size with less than 50% respiratory variability, suggesting right atrial pressure of 8 mmHg. IAS/Shunts: No atrial level shunt detected by color flow Doppler. EKG: Rhythm strip during this exam demostrated sinus tachycardia.  LEFT VENTRICLE PLAX 2D LVIDd:         3.90 cm LVIDs:         2.70 cm  LV PW:         1.30 cm LV IVS:        1.20 cm LVOT diam:     2.00 cm LV SV:         52 LV SV Index:   27 LVOT Area:     3.14 cm  RIGHT VENTRICLE RV S prime:     17.70 cm/s TAPSE (M-mode): 2.4 cm LEFT ATRIUM             Index       RIGHT ATRIUM          Index LA diam:        3.30 cm 1.73 cm/m  RA Area:     8.83 cm LA Vol (A2C):   58.8 ml 30.76 ml/m RA Volume:   15.50 ml 8.11 ml/m LA Vol (A4C):   33.6 ml 17.58 ml/m LA Biplane Vol: 46.3 ml 24.22 ml/m  AORTIC VALVE LVOT Vmax:   94.20 cm/s LVOT Vmean:  71.900 cm/s LVOT VTI:    0.167 m  AORTA Ao Root diam: 3.60 cm TRICUSPID VALVE TR Peak grad:   26.4 mmHg TR Vmax:        257.00 cm/s  SHUNTS Systemic VTI:  0.17 m Systemic Diam: 2.00 cm Eleonore Chiquito MD Electronically signed by Eleonore Chiquito MD Signature Date/Time: 05/06/2019/4:19:49 PM    Final         Scheduled Meds: . atorvastatin  20 mg Oral BH-q7a  . Chlorhexidine Gluconate Cloth  6 each Topical Q0600  . diltiazem  120 mg Oral Daily  . doxycycline  100 mg Oral Q12H  .  furosemide  80 mg Intravenous Q8H  . heparin  5,000 Units Subcutaneous Q8H  . metoprolol tartrate  5 mg Intravenous Once  . polyethylene glycol  17 g Oral Daily  . sodium chloride flush  10-40 mL Intracatheter Q12H   Continuous Infusions: . cefTRIAXone (ROCEPHIN)  IV 2 g (05/07/19 2122)     LOS: 4 days    Time spent: 35 minutes    Nicolette Bang, MD [facility]  If 7PM-7AM, please contact night-coverage  05/08/2019, 2:09 PM

## 2019-05-09 LAB — CBC WITH DIFFERENTIAL/PLATELET
Abs Immature Granulocytes: 0.18 10*3/uL — ABNORMAL HIGH (ref 0.00–0.07)
Basophils Absolute: 0 10*3/uL (ref 0.0–0.1)
Basophils Relative: 0 %
Eosinophils Absolute: 0.2 10*3/uL (ref 0.0–0.5)
Eosinophils Relative: 3 %
HCT: 34 % — ABNORMAL LOW (ref 39.0–52.0)
Hemoglobin: 12.1 g/dL — ABNORMAL LOW (ref 13.0–17.0)
Immature Granulocytes: 2 %
Lymphocytes Relative: 7 %
Lymphs Abs: 0.6 10*3/uL — ABNORMAL LOW (ref 0.7–4.0)
MCH: 31.2 pg (ref 26.0–34.0)
MCHC: 35.6 g/dL (ref 30.0–36.0)
MCV: 87.6 fL (ref 80.0–100.0)
Monocytes Absolute: 1 10*3/uL (ref 0.1–1.0)
Monocytes Relative: 11 %
Neutro Abs: 6.7 10*3/uL (ref 1.7–7.7)
Neutrophils Relative %: 77 %
Platelets: 159 10*3/uL (ref 150–400)
RBC: 3.88 MIL/uL — ABNORMAL LOW (ref 4.22–5.81)
RDW: 12.8 % (ref 11.5–15.5)
WBC: 8.7 10*3/uL (ref 4.0–10.5)
nRBC: 0 % (ref 0.0–0.2)

## 2019-05-09 LAB — BASIC METABOLIC PANEL
Anion gap: 15 (ref 5–15)
BUN: 60 mg/dL — ABNORMAL HIGH (ref 6–20)
CO2: 21 mmol/L — ABNORMAL LOW (ref 22–32)
Calcium: 8.3 mg/dL — ABNORMAL LOW (ref 8.9–10.3)
Chloride: 98 mmol/L (ref 98–111)
Creatinine, Ser: 5.37 mg/dL — ABNORMAL HIGH (ref 0.61–1.24)
GFR calc Af Amer: 13 mL/min — ABNORMAL LOW (ref >60)
GFR calc non Af Amer: 11 mL/min — ABNORMAL LOW (ref >60)
Glucose, Bld: 181 mg/dL — ABNORMAL HIGH (ref 70–99)
Potassium: 3 mmol/L — ABNORMAL LOW (ref 3.5–5.1)
Sodium: 134 mmol/L — ABNORMAL LOW (ref 135–145)

## 2019-05-09 LAB — HEPATITIS B CORE ANTIBODY, TOTAL: Hep B Core Total Ab: NONREACTIVE

## 2019-05-09 LAB — GLUCOSE, CAPILLARY: Glucose-Capillary: 174 mg/dL — ABNORMAL HIGH (ref 70–99)

## 2019-05-09 LAB — HEPATITIS B SURFACE ANTIGEN: Hepatitis B Surface Ag: NONREACTIVE

## 2019-05-09 LAB — HEPATITIS B SURFACE ANTIBODY,QUALITATIVE: Hep B S Ab: NONREACTIVE

## 2019-05-09 MED ORDER — SODIUM CHLORIDE 0.9 % IV SOLN: INTRAVENOUS | Status: DC

## 2019-05-09 MED ORDER — POTASSIUM CHLORIDE CRYS ER 20 MEQ PO TBCR
20.0000 meq | EXTENDED_RELEASE_TABLET | Freq: Three times a day (TID) | ORAL | Status: AC
  Administered 2019-05-09 (×2): 20 meq via ORAL
  Filled 2019-05-09 (×2): qty 1

## 2019-05-09 MED ORDER — HEPARIN SODIUM (PORCINE) 1000 UNIT/ML DIALYSIS: 2000.0000 [IU] | INTRAMUSCULAR | Status: DC | PRN

## 2019-05-09 NOTE — Progress Notes (Signed)
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 Urology was consulted and patient was taken to the OR on 3/16 evening for right cystoscopy with right ureteral stent placement and urethral dilation.  3/18: A. fib with RVR started on Cardizem drip. Renal function worsened with creatinine up to 7. Nephrology consulted. Hypoxic, chest x-ray showing concern for pneumonia, neph optimisitc for renal recovery   Assessment & Plan:   Principal Problem:   Acute renal failure (ARF) ([place]) Active Problems:   Diabetes ([place])   Essential hypertension   Hyperlipidemia   Acute pyelonephritis   1. AKI secondary to obstructive uropathy, s/p cystoscopy with right ureteral stent placement and urethral dilation (05/04/19, Dr. Gloriann Loan) a. Monitoring output b. Monitor lytes c. neph on board, creatrinien continues to recover, 60/5.4 on 3/21 2. Sepsis secondary to E. coli UTI, concern for developing pneumoniasepsis now resolved a. Continue Rocephin, day6/7, Doxycycline Day 4/5 (added on 3/18) b. Blood cultures no growth to date c. Urine cult sensitive to ceftriax 3. Acute hypoxic respiratory failure, concern for developing CAP a. Day6Rocephin, day 4 Doxy. Hold off on azithromycin given arrhythmia b. Responded well to lasix 4. A. fib with RVR Likely secondary to sepsis a. CHA2DS2-VASc: At least 2 (hypertension, diabetes) however off heparin drip given recent urologic procedure and gross hematuria for now b. Off dilt gtt, on cardizem cd 120 - controlled 5. Hypomagnesemia replete as  needed 6. Recurrent nephrolithiasis Plan per urology 7. Hypertension Stable off meds 8. Hyperlipidemia cont home Lipitor on d/c 9. Diabetes Continue sliding scale   DVT prophylaxis: Heparin SQ  Code Status: full    Code Status Orders  (From admission, onward)         Start     Ordered   05/05/19 0054  Full code  Continuous     05/05/19 0053        Code Status History    Date Active Date Inactive Code Status Order ID Comments User Context   07/23/2018 2033 07/24/2018 1722 Full Code [id number]  Debbrah Alar, PA-C Inpatient   Advance Care Planning Activity     Family Communication: called wife, answered all questions Disposition Plan:   Patient not yet ready for discharge, still pending renal recovery although improving, anticipate 1 to 2 days. Consults called: None Admission status: Inpatient   Consultants:   Nephrology, urology  Procedures:  CT ABDOMEN PELVIS WO CONTRAST  Result Date: 05/04/2019 CLINICAL DATA:  Right flank pain EXAM: CT ABDOMEN AND PELVIS WITHOUT CONTRAST TECHNIQUE: Multidetector CT imaging of the abdomen and pelvis was performed following the standard protocol without IV contrast. COMPARISON:  None. FINDINGS: LOWER CHEST: Normal. HEPATOBILIARY: Normal hepatic contours. No intra- or extrahepatic biliary dilatation. The gallbladder is normal. PANCREAS: Normal pancreas. No ductal dilatation or peripancreatic fluid collection. SPLEEN: Normal. ADRENALS/URINARY TRACT: The adrenal glands are normal. There is a stone within the distal right ureter measuring 4 mm, causing mild hydronephrosis and moderate hydroureter and mild perinephric stranding. Left kidney is surgically absent. No abnormality of the left renal fossa. The urinary bladder is normal for degree of distention STOMACH/BOWEL: There is no hiatal hernia. Normal  duodenal course and caliber. No small bowel dilatation or inflammation. No focal colonic abnormality. Normal appendix. VASCULAR/LYMPHATIC: Normal  course and caliber of the major abdominal vessels. No abdominal or pelvic lymphadenopathy. REPRODUCTIVE: Normal prostate size with symmetric seminal vesicles. MUSCULOSKELETAL. No bony spinal canal stenosis or focal osseous abnormality. OTHER: None. IMPRESSION: 1. Right obstructive uropathy with 4 mm stone in the distal right ureter causing mild hydronephrosis and moderate hydroureter. 2. Status post left nephrectomy. Electronically Signed   By: Ulyses Jarred M.D.   On: 05/04/2019 20:05   DG Abd 1 View  Result Date: 05/05/2019 CLINICAL DATA:  Renal insufficiency right-sided stent EXAM: ABDOMEN - 1 VIEW COMPARISON:  CT 05/04/2019 FINDINGS: Mild gaseous prominence of upper small bowel without definitive obstruction. Interim placement of right-sided ureteral stent. No definitive radiopaque calculi. IMPRESSION: Placement of a right-sided ureteral stent. Nonobstructed bowel-gas pattern Electronically Signed   By: Donavan Foil M.D.   On: 05/05/2019 18:21   US RENAL  Result Date: 05/06/2019 CLINICAL DATA:  Acute kidney injury. History of left nephro ureterectomy. EXAM: RENAL / URINARY TRACT ULTRASOUND COMPLETE COMPARISON:  06/16/2018 FINDINGS: Right Kidney: Renal measurements: 13.7 x 6.4 x 7.3 cm = volume: 335 mL . Echogenicity within normal limits. No mass or hydronephrosis visualized. Left Kidney: Surgically absent. Bladder: Decompressed by Foley catheter. Other: None. IMPRESSION: 1. Unremarkable right kidney.  Specifically, no hydronephrosis. 2. Left kidney surgically absent. Electronically Signed   By: Misty Stanley M.D.   On: 05/06/2019 08:50   IR Fluoro Guide CV Line Right  Result Date: 05/07/2019 CLINICAL DATA:  Acute renal failure, needs access for planned dialysis EXAM: EXAM RIGHT IJ CATHETER PLACEMENT UNDER ULTRASOUND AND FLUOROSCOPIC GUIDANCE TECHNIQUE: The procedure, risks (including but not limited to bleeding, infection, organ damage, pneumothorax), benefits, and alternatives were explained to the  patient. Questions regarding the procedure were encouraged and answered. The patient understands and consents to the procedure. Patency of the right IJ vein was confirmed with ultrasound with image documentation. An appropriate skin site was determined. Skin site was marked. Region was prepped using maximum barrier technique including cap and mask, sterile gown, sterile gloves, large sterile sheet, and Chlorhexidine as cutaneous antisepsis. The region was infiltrated locally with 1% lidocaine. Under real-time ultrasound guidance, the right IJ vein was accessed with a 19 gauge needle; the needle tip within the vein was confirmed with ultrasound image documentation. The needle exchanged over a guidewire for vascular dilator which allowed advancement of a 20 cm Trialysis catheter. This was positioned with the tip at the cavoatrial junction. Spot chest radiograph shows good positioning and no pneumothorax. Catheter was flushed and sutured externally with 0-Prolene sutures. Patient tolerated the procedure well. FLUOROSCOPY TIME:  0.1 minute COMPLICATIONS: COMPLICATIONS none IMPRESSION: 1. Technically successful right IJ Trialysis catheter placement. Electronically Signed   By: Lucrezia Europe M.D.   On: 05/07/2019 16:26   IR US Guide Vasc Access Right  Result Date: 05/07/2019 CLINICAL DATA:  Acute renal failure, needs access for planned dialysis EXAM: EXAM RIGHT IJ CATHETER PLACEMENT UNDER ULTRASOUND AND FLUOROSCOPIC GUIDANCE TECHNIQUE: The procedure, risks (including but not limited to bleeding, infection, organ damage, pneumothorax), benefits, and alternatives were explained to the patient. Questions regarding the procedure were encouraged and answered. The patient understands and consents to the procedure. Patency of the right IJ vein was confirmed with ultrasound with image documentation. An appropriate skin site was determined. Skin site was marked. Region was prepped using maximum barrier technique including cap and  mask, sterile gown,  sterile gloves, large sterile sheet, and Chlorhexidine as cutaneous antisepsis. The region was infiltrated locally with 1% lidocaine. Under real-time ultrasound guidance, the right IJ vein was accessed with a 19 gauge needle; the needle tip within the vein was confirmed with ultrasound image documentation. The needle exchanged over a guidewire for vascular dilator which allowed advancement of a 20 cm Trialysis catheter. This was positioned with the tip at the cavoatrial junction. Spot chest radiograph shows good positioning and no pneumothorax. Catheter was flushed and sutured externally with 0-Prolene sutures. Patient tolerated the procedure well. FLUOROSCOPY TIME:  0.1 minute COMPLICATIONS: COMPLICATIONS none IMPRESSION: 1. Technically successful right IJ Trialysis catheter placement. Electronically Signed   By: Lucrezia Europe M.D.   On: 05/07/2019 16:26   DG CHEST PORT 1 VIEW  Result Date: 05/06/2019 CLINICAL DATA:  Shortness of breath EXAM: PORTABLE CHEST 1 VIEW COMPARISON:  Chest CT April 29, 2010 FINDINGS: There is consolidation in the medial right base region. There is subtle ill-defined opacity in each upper lobe. Lungs elsewhere clear. Heart size and pulmonary vascularity are normal. No adenopathy. No bone lesions. IMPRESSION: Airspace opacity medial right base with more subtle opacity in each upper lobe. Appearance consistent with pneumonia, most notable in the medial right base. Cardiac silhouette within normal limits. No adenopathy. Electronically Signed   By: Lowella Grip III M.D.   On: 05/06/2019 12:40   DG C-Arm 1-60 Min-No Report  Result Date: 05/04/2019 Fluoroscopy was utilized by the requesting physician.  No radiographic interpretation.   ECHOCARDIOGRAM COMPLETE  Result Date: 05/06/2019    ECHOCARDIOGRAM REPORT   Patient Name:   CLEBURNE SAVINI Date of Exam: 05/06/2019 Medical Rec #:  [id number]         Height:       66.0 in Accession #:    [id number]         Weight:       179.8 lb Date of Birth:  [birth year]         BSA:          1.911 m Patient Age:    10 years          BP:           116/87 mmHg Patient Gender: M                 HR:           103 bpm. Exam Location:  Inpatient Procedure: 2D Echo Indications:    Abnormal ECG 794.31 / R94.31  History:        Patient has no prior history of Echocardiogram examinations.                 Aortic Valve Disease, Signs/Symptoms:Fever; Risk                 Factors:Diabetes and Hypertension. Tachycardia. Kidney Stones.                 Left nephrectomy. Acute ptelonephritis.  Sonographer:    Darlina Sicilian RDCS Referring Phys: [id number] [place]  1. Left ventricular ejection fraction, by estimation, is 60 to 65%. The left ventricle has normal function. The left ventricle has no regional wall motion abnormalities. There is mild concentric left ventricular hypertrophy. Indeterminate diastolic filling due to E-A fusion.  2. Right ventricular systolic function is normal. The right ventricular size is normal. There is normal pulmonary artery systolic pressure. The estimated right ventricular systolic pressure is 58.5 mmHg.  3. The  mitral valve is grossly normal. Trivial mitral valve regurgitation. No evidence of mitral stenosis.  4. The aortic valve is tricuspid. Aortic valve regurgitation is not visualized. No aortic stenosis is present.  5. The inferior vena cava is normal in size with <50% respiratory variability, suggesting right atrial pressure of 8 mmHg. FINDINGS  Left Ventricle: Left ventricular ejection fraction, by estimation, is 60 to 65%. The left ventricle has normal function. The left ventricle has no regional wall motion abnormalities. The left ventricular internal cavity size was normal in size. There is  mild concentric left ventricular hypertrophy. Indeterminate diastolic filling due to E-A fusion. Right Ventricle: The right ventricular size is normal. No increase in right ventricular wall thickness. Right  ventricular systolic function is normal. There is normal pulmonary artery systolic pressure. The tricuspid regurgitant velocity is 2.57 m/s, and  with an assumed right atrial pressure of 8 mmHg, the estimated right ventricular systolic pressure is 03.8 mmHg. Left Atrium: Left atrial size was normal in size. Right Atrium: Right atrial size was normal in size. Pericardium: There is no evidence of pericardial effusion. Presence of pericardial fat pad. Mitral Valve: The mitral valve is grossly normal. Trivial mitral valve regurgitation. No evidence of mitral valve stenosis. Tricuspid Valve: The tricuspid valve is grossly normal. Tricuspid valve regurgitation is trivial. No evidence of tricuspid stenosis. Aortic Valve: The aortic valve is tricuspid. Aortic valve regurgitation is not visualized. No aortic stenosis is present. Pulmonic Valve: The pulmonic valve was grossly normal. Pulmonic valve regurgitation is not visualized. No evidence of pulmonic stenosis. Aorta: The aortic root and ascending aorta are structurally normal, with no evidence of dilitation. Venous: The inferior vena cava is normal in size with less than 50% respiratory variability, suggesting right atrial pressure of 8 mmHg. IAS/Shunts: No atrial level shunt detected by color flow Doppler. EKG: Rhythm strip during this exam demostrated sinus tachycardia.  LEFT VENTRICLE PLAX 2D LVIDd:         3.90 cm LVIDs:         2.70 cm LV PW:         1.30 cm LV IVS:        1.20 cm LVOT diam:     2.00 cm LV SV:         52 LV SV Index:   27 LVOT Area:     3.14 cm  RIGHT VENTRICLE RV S prime:     17.70 cm/s TAPSE (M-mode): 2.4 cm LEFT ATRIUM             Index       RIGHT ATRIUM          Index LA diam:        3.30 cm 1.73 cm/m  RA Area:     8.83 cm LA Vol (A2C):   58.8 ml 30.76 ml/m RA Volume:   15.50 ml 8.11 ml/m LA Vol (A4C):   33.6 ml 17.58 ml/m LA Biplane Vol: 46.3 ml 24.22 ml/m  AORTIC VALVE LVOT Vmax:   94.20 cm/s LVOT Vmean:  71.900 cm/s LVOT VTI:    0.167  m  AORTA Ao Root diam: 3.60 cm TRICUSPID VALVE TR Peak grad:   26.4 mmHg TR Vmax:        257.00 cm/s  SHUNTS Systemic VTI:  0.17 m Systemic Diam: 2.00 cm Eleonore Chiquito MD Electronically signed by Eleonore Chiquito MD Signature Date/Time: 05/06/2019/4:19:49 PM    Final      Antimicrobials:   Ceftriaxone day 6 of 7  doxycycline day 4 of 5   Subjective: Patient good urinary output overnight No acute events reports feeling better  Objective: Vitals:   05/09/19 0300 05/09/19 0325 05/09/19 0533 05/09/19 0901  BP: (!) 148/96 (!) 145/94 (!) 140/94 138/90  Pulse: 81 87 96 90  Resp:  18 18 18   Temp:  98 F (36.7 C) 100 F (37.8 C) 98 F (36.7 C)  TempSrc:  Oral Oral Oral  SpO2:   96% 96%  Weight:  82.3 kg    Height:        Intake/Output Summary (Last 24 hours) at 05/09/2019 1258 Last data filed at 05/09/2019 1207 Gross per 24 hour  Intake 1500 ml  Output 6554 ml  Net -5054 ml   Filed Weights   05/08/19 0500 05/08/19 2128 05/09/19 0325  Weight: 89.7 kg 84.3 kg 82.3 kg    Examination:  General exam: Appears calm and comfortable  Respiratory system: Clear to auscultation. Respiratory effort normal. Cardiovascular system: S1 & S2 heard, RRR. No JVD, murmurs, rubs, gallops or clicks. No pedal edema. Gastrointestinal system: Abdomen is nondistended, soft and nontender. No organomegaly or masses felt. Normal bowel sounds heard. Central nervous system: Alert and oriented. No focal neurological deficits. Extremities: Mild edema bilaterally lower extremities. Skin: No rashes, lesions or ulcers Psychiatry: Judgement and insight appear normal. Mood & affect appropriate. .     Data Reviewed: I have personally reviewed following labs and imaging studies  CBC: Recent Labs  Lab 05/04/19 1827 05/04/19 1827 05/05/19 0127 05/06/19 0441 05/07/19 0430 05/08/19 0449 05/09/19 0429  WBC 17.9*   < > 9.4 11.4* 8.7 7.9 8.7  NEUTROABS 16.5*  --   --   --   --   --  6.7  HGB 14.7   < > 11.9*  11.6* 11.2* 11.4* 12.1*  HCT 41.8   < > 35.0* 34.4* 33.3* 33.9* 34.0*  MCV 91.7   < > 92.6 94.0 94.6 91.4 87.6  PLT 160   < > 116* 113* 120* 123* 159   < > = values in this interval not displayed.   Basic Metabolic Panel: Recent Labs  Lab 05/05/19 0127 05/06/19 0441 05/06/19 1618 05/07/19 0430 05/07/19 0434 05/08/19 0449 05/09/19 0429  NA 137 136  --  133*  --  134* 134*  K 5.1 4.8  --  5.0  --  4.3 3.0*  CL 107 106  --  104  --  103 98  CO2 20* 18*  --  16*  --  16* 21*  GLUCOSE 144* 118*  --  113*  --  154* 181*  BUN 45* 77*  --  96*  --  98* 60*  CREATININE 5.07* 7.85*  --  8.86*  --  9.08* 5.37*  CALCIUM 7.7* 7.7*  --  7.9*  --  8.6* 8.3*  MG  --   --  1.3*  --  2.4  --   --    GFR: Estimated Creatinine Clearance: 16.4 mL/min (A) (by C-G formula based on SCr of 5.37 mg/dL (H)). Liver Function Tests: Recent Labs  Lab 05/04/19 1826 05/05/19 0127 05/08/19 0449  AST 24 22 18   ALT 23 21 20   ALKPHOS 61 42 89  BILITOT 2.1* 1.4* 0.6  PROT 7.3 5.5* 5.3*  ALBUMIN 4.2 3.1* 2.2*   Recent Labs  Lab 05/04/19 1826  LIPASE 23   No results for input(s): AMMONIA in the last 168 hours. Coagulation Profile: No results for input(s): INR, PROTIME in the  last 168 hours. Cardiac Enzymes: Recent Labs  Lab 05/04/19 1826  CKTOTAL 107   BNP (last 3 results) No results for input(s): PROBNP in the last 8760 hours. HbA1C: No results for input(s): HGBA1C in the last 72 hours. CBG: Recent Labs  Lab 05/04/19 2308 05/08/19 2116 05/09/19 0640  GLUCAP 119* 133* 174*   Lipid Profile: No results for input(s): CHOL, HDL, LDLCALC, TRIG, CHOLHDL, LDLDIRECT in the last 72 hours. Thyroid Function Tests: No results for input(s): TSH, T4TOTAL, FREET4, T3FREE, THYROIDAB in the last 72 hours. Anemia Panel: No results for input(s): VITAMINB12, FOLATE, FERRITIN, TIBC, IRON, RETICCTPCT in the last 72 hours. Sepsis Labs: No results for input(s): PROCALCITON, LATICACIDVEN in the last 168  hours.  Recent Results (from the past 240 hour(s))  Respiratory Panel by RT PCR (Flu A&B, Covid) - Nasopharyngeal Swab     Status: None   Collection Time: 05/04/19  8:33 PM   Specimen: Nasopharyngeal Swab  Result Value Ref Range Status   SARS Coronavirus 2 by RT PCR NEGATIVE NEGATIVE Final    Comment: (NOTE) SARS-CoV-2 target nucleic acids are NOT DETECTED. The SARS-CoV-2 RNA is generally detectable in upper respiratoy specimens during the acute phase of infection. The lowest concentration of SARS-CoV-2 viral copies this assay can detect is 131 copies/mL. A negative result does not preclude SARS-Cov-2 infection and should not be used as the sole basis for treatment or other patient management decisions. A negative result may occur with  improper specimen collection/handling, submission of specimen other than nasopharyngeal swab, presence of viral mutation(s) within the areas targeted by this assay, and inadequate number of viral copies (<131 copies/mL). A negative result must be combined with clinical observations, patient history, and epidemiological information. The expected result is Negative. Fact Sheet for Patients:  PinkCheek.be Fact Sheet for Healthcare Providers:  GravelBags.it This test is not yet ap proved or cleared by the [place] FDA and  has been authorized for detection and/or diagnosis of SARS-CoV-2 by FDA under an Emergency Use Authorization (EUA). This EUA will remain  in effect (meaning this test can be used) for the duration of the COVID-19 declaration under Section 564(b)(1) of the Act, 21 U.S.C. section 360bbb-3(b)(1), unless the authorization is terminated or revoked sooner.    Influenza A by PCR NEGATIVE NEGATIVE Final   Influenza B by PCR NEGATIVE NEGATIVE Final    Comment: (NOTE) The Xpert Xpress SARS-CoV-2/FLU/RSV assay is intended as an aid in  the diagnosis of influenza from Nasopharyngeal  swab specimens and  should not be used as a sole basis for treatment. Nasal washings and  aspirates are unacceptable for Xpert Xpress SARS-CoV-2/FLU/RSV  testing. Fact Sheet for Patients: PinkCheek.be Fact Sheet for Healthcare Providers: GravelBags.it This test is not yet approved or cleared by the [place] FDA and  has been authorized for detection and/or diagnosis of SARS-CoV-2 by  FDA under an Emergency Use Authorization (EUA). This EUA will remain  in effect (meaning this test can be used) for the duration of the  Covid-19 declaration under Section 564(b)(1) of the Act, 21  U.S.C. section 360bbb-3(b)(1), unless the authorization is  terminated or revoked. Performed at [facility], [place] [address], [place], [place] [address]   Urine Culture     Status: Abnormal   Collection Time: 05/04/19 10:53 PM   Specimen: Urine, Cystoscope  Result Value Ref Range Status   Specimen Description   Final    URINE, CLEAN CATCH Performed at [facility], [address] [place], [place] [address]    Special Requests   Final    CYSTOSCOPE Performed at [facility], [place] [address], [place], [place] [address]    Culture >=100,000 COLONIES/mL ESCHERICHIA COLI (A)  Final   Report Status 05/07/2019 FINAL  Final   Organism ID, Bacteria ESCHERICHIA COLI (A)  Final      Susceptibility   Escherichia coli - MIC*    AMPICILLIN <=2 SENSITIVE Sensitive     CEFAZOLIN <=4 SENSITIVE Sensitive     CEFTRIAXONE <=0.25 SENSITIVE Sensitive     CIPROFLOXACIN <=0.25 SENSITIVE Sensitive     GENTAMICIN <=1 SENSITIVE Sensitive     IMIPENEM <=0.25 SENSITIVE Sensitive     NITROFURANTOIN <=16 SENSITIVE Sensitive     TRIMETH/SULFA <=20 SENSITIVE Sensitive     AMPICILLIN/SULBACTAM <=2 SENSITIVE Sensitive     PIP/TAZO <=4 SENSITIVE Sensitive     * >=100,000 COLONIES/mL ESCHERICHIA COLI  Culture, blood  (routine x 2)     Status: None (Preliminary result)   Collection Time: 05/05/19  1:27 AM   Specimen: BLOOD  Result Value Ref Range Status   Specimen Description   Final    BLOOD RIGHT ANTECUBITAL Performed at [facility] [address], [place], [place] [address]    Special Requests   Final    BOTTLES DRAWN AEROBIC AND ANAEROBIC Blood Culture adequate volume Performed at [facility] [address] [place], [place] [address]    Culture   Final    NO GROWTH 4 DAYS Performed at [facility], [place] [address] [place], [place] [address]    Report Status PENDING  Incomplete  Culture, blood (routine x 2)     Status: None (Preliminary result)   Collection Time: 05/05/19  1:27 AM   Specimen: BLOOD RIGHT HAND  Result Value Ref Range Status   Specimen Description   Final    BLOOD RIGHT HAND Performed at [facility], [place] [address], [place], [place] [address]    Special Requests   Final    BOTTLES DRAWN AEROBIC ONLY Blood Culture adequate volume Performed at [facility] [address] [place], [place] [address]    Culture   Final    NO GROWTH 4 DAYS Performed at [facility], [place] [address], [place],  [address]    Report Status PENDING  Incomplete         Radiology Studies: IR Fluoro Guide CV Line Right  Result Date: 05/07/2019 CLINICAL DATA:  Acute renal failure, needs access for planned dialysis EXAM: EXAM RIGHT IJ CATHETER PLACEMENT UNDER ULTRASOUND AND FLUOROSCOPIC GUIDANCE TECHNIQUE: The procedure, risks (including but not limited to bleeding, infection, organ damage, pneumothorax), benefits, and alternatives were explained to the patient. Questions regarding the procedure were encouraged and answered. The patient understands and consents to the procedure. Patency of the right IJ vein was confirmed with ultrasound with image documentation. An appropriate skin site was determined. Skin site  was marked. Region was prepped using maximum barrier technique including cap and mask, sterile gown, sterile gloves, large sterile sheet, and Chlorhexidine as cutaneous antisepsis. The region was infiltrated locally with 1% lidocaine. Under real-time ultrasound guidance, the right IJ vein was accessed with a 19 gauge needle; the needle tip within the vein was confirmed with ultrasound image documentation. The needle exchanged over a guidewire for vascular dilator which allowed advancement of a 20 cm Trialysis catheter. This was positioned with the tip  at the cavoatrial junction. Spot chest radiograph shows good positioning and no pneumothorax. Catheter was flushed and sutured externally with 0-Prolene sutures. Patient tolerated the procedure well. FLUOROSCOPY TIME:  0.1 minute COMPLICATIONS: COMPLICATIONS none IMPRESSION: 1. Technically successful right IJ Trialysis catheter placement. Electronically Signed   By: Lucrezia Europe M.D.   On: 05/07/2019 16:26   IR US Guide Vasc Access Right  Result Date: 05/07/2019 CLINICAL DATA:  Acute renal failure, needs access for planned dialysis EXAM: EXAM RIGHT IJ CATHETER PLACEMENT UNDER ULTRASOUND AND FLUOROSCOPIC GUIDANCE TECHNIQUE: The procedure, risks (including but not limited to bleeding, infection, organ damage, pneumothorax), benefits, and alternatives were explained to the patient. Questions regarding the procedure were encouraged and answered. The patient understands and consents to the procedure. Patency of the right IJ vein was confirmed with ultrasound with image documentation. An appropriate skin site was determined. Skin site was marked. Region was prepped using maximum barrier technique including cap and mask, sterile gown, sterile gloves, large sterile sheet, and Chlorhexidine as cutaneous antisepsis. The region was infiltrated locally with 1% lidocaine. Under real-time ultrasound guidance, the right IJ vein was accessed with a 19 gauge needle; the needle tip  within the vein was confirmed with ultrasound image documentation. The needle exchanged over a guidewire for vascular dilator which allowed advancement of a 20 cm Trialysis catheter. This was positioned with the tip at the cavoatrial junction. Spot chest radiograph shows good positioning and no pneumothorax. Catheter was flushed and sutured externally with 0-Prolene sutures. Patient tolerated the procedure well. FLUOROSCOPY TIME:  0.1 minute COMPLICATIONS: COMPLICATIONS none IMPRESSION: 1. Technically successful right IJ Trialysis catheter placement. Electronically Signed   By: Lucrezia Europe M.D.   On: 05/07/2019 16:26        Scheduled Meds: . atorvastatin  20 mg Oral BH-q7a  . Chlorhexidine Gluconate Cloth  6 each Topical Q0600  . diltiazem  120 mg Oral Daily  . doxycycline  100 mg Oral Q12H  . heparin  5,000 Units Subcutaneous Q8H  . metoprolol tartrate  5 mg Intravenous Once  . polyethylene glycol  17 g Oral Daily  . potassium chloride  20 mEq Oral TID  . sodium chloride flush  10-40 mL Intracatheter Q12H   Continuous Infusions: . sodium chloride 45 mL/hr at 05/09/19 1120  . cefTRIAXone (ROCEPHIN)  IV Stopped (05/09/19 0547)     LOS: 5 days    Time spent: 35 minutes    Nicolette Bang, MD [facility]  If 7PM-7AM, please contact night-coverage  05/09/2019, 12:58 PM

## 2019-05-09 NOTE — Progress Notes (Signed)
[facility] Progress Note  Subjective: 2L off on HD yest, and 4.7 L uop yest ! Will dc IV lasix, close to admit wt's.   Vitals:   05/09/19 0300 05/09/19 0325 05/09/19 0533 05/09/19 0901  BP: (!) 148/96 (!) 145/94 (!) 140/94 138/90  Pulse: 81 87 96 90  Resp:  18 18 18   Temp:  98 F (36.7 C) 100 F (37.8 C) 98 F (36.7 C)  TempSrc:  Oral Oral Oral  SpO2:   96% 96%  Weight:  82.3 kg    Height:        Exam: R IJ temp cath   Alert, no distress, lying flat   No jvd  Chest clear bilat   Cor reg no RG   Abd soft ntnd no ascites    Ext no edema    NF Ox 3, no asterixis   37M with solitary kidney presenting with obstructing R distal ureteral stone with complication of pyelonephritis; now with AKI  Assessment/ Plan 1. AKI - BL SCr 1.5 w/ solitary kidney. Etiology including obstruction+ pyelo + ACEi. Had HD x1 3/20 (Sat) for ^B/ Cr 98/ 9.1. Labs better, now w/ large UOP, will hold on further HD, get labs in am. Should recover now that obstruction relieved.  2. Pyelonephritis- w/ ureteral obstruction d/t stone.  EColi by UCx , Bcx's negative, on IV abx 3. Distal ureteral stone - status post ureteral stent on 3/16. For stone extraction at a later date.  4. 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 HTN/volume - hold ACEi/ thiazide for now. Vol excess resolved, down to admit wt, will dc lasix and start IVF 50/ hr while watching for recovery.  6. DM2 - Metformin on hold    Rob Audwin Semper 05/09/2019, 10:55 AM   Recent Labs  Lab 05/08/19 0449 05/09/19 0429  K 4.3 3.0*  BUN 98* 60*  CREATININE 9.08* 5.37*  CALCIUM 8.6* 8.3*  HGB 11.4* 12.1*   Inpatient medications: . atorvastatin  20 mg Oral BH-q7a  . Chlorhexidine Gluconate Cloth  6 each Topical Q0600  . diltiazem  120 mg Oral Daily  . doxycycline  100 mg Oral Q12H  . heparin  5,000 Units Subcutaneous Q8H  . metoprolol tartrate  5 mg Intravenous Once  . polyethylene glycol  17 g  Oral Daily  . sodium chloride flush  10-40 mL Intracatheter Q12H   . cefTRIAXone (ROCEPHIN)  IV Stopped (05/09/19 0547)   acetaminophen **OR** acetaminophen, albuterol, calcium carbonate (dosed in mg elemental calcium), camphor-menthol **AND** hydrOXYzine, docusate sodium, feeding supplement (NEPRO CARB STEADY), HYDROcodone-acetaminophen, lidocaine, ondansetron **OR** ondansetron (ZOFRAN) IV, senna, sodium chloride flush, sorbitol

## 2019-05-10 LAB — CBC WITH DIFFERENTIAL/PLATELET
Abs Immature Granulocytes: 0.24 10*3/uL — ABNORMAL HIGH (ref 0.00–0.07)
Basophils Absolute: 0 10*3/uL (ref 0.0–0.1)
Basophils Relative: 0 %
Eosinophils Absolute: 0.2 10*3/uL (ref 0.0–0.5)
Eosinophils Relative: 2 %
HCT: 30.1 % — ABNORMAL LOW (ref 39.0–52.0)
Hemoglobin: 10.3 g/dL — ABNORMAL LOW (ref 13.0–17.0)
Immature Granulocytes: 3 %
Lymphocytes Relative: 10 %
Lymphs Abs: 0.8 10*3/uL (ref 0.7–4.0)
MCH: 30.8 pg (ref 26.0–34.0)
MCHC: 34.2 g/dL (ref 30.0–36.0)
MCV: 90.1 fL (ref 80.0–100.0)
Monocytes Absolute: 0.8 10*3/uL (ref 0.1–1.0)
Monocytes Relative: 10 %
Neutro Abs: 6 10*3/uL (ref 1.7–7.7)
Neutrophils Relative %: 75 %
Platelets: 158 10*3/uL (ref 150–400)
RBC: 3.34 MIL/uL — ABNORMAL LOW (ref 4.22–5.81)
RDW: 13.1 % (ref 11.5–15.5)
WBC: 8 10*3/uL (ref 4.0–10.5)
nRBC: 0 % (ref 0.0–0.2)

## 2019-05-10 LAB — BASIC METABOLIC PANEL
Anion gap: 14 (ref 5–15)
BUN: 72 mg/dL — ABNORMAL HIGH (ref 6–20)
CO2: 19 mmol/L — ABNORMAL LOW (ref 22–32)
Calcium: 8.3 mg/dL — ABNORMAL LOW (ref 8.9–10.3)
Chloride: 101 mmol/L (ref 98–111)
Creatinine, Ser: 5.26 mg/dL — ABNORMAL HIGH (ref 0.61–1.24)
GFR calc Af Amer: 14 mL/min — ABNORMAL LOW (ref >60)
GFR calc non Af Amer: 12 mL/min — ABNORMAL LOW (ref >60)
Glucose, Bld: 137 mg/dL — ABNORMAL HIGH (ref 70–99)
Potassium: 3.6 mmol/L (ref 3.5–5.1)
Sodium: 134 mmol/L — ABNORMAL LOW (ref 135–145)

## 2019-05-10 LAB — CULTURE, BLOOD (ROUTINE X 2)
Culture: NO GROWTH
Culture: NO GROWTH
Special Requests: ADEQUATE
Special Requests: ADEQUATE

## 2019-05-10 LAB — GLUCOSE, CAPILLARY: Glucose-Capillary: 128 mg/dL — ABNORMAL HIGH (ref 70–99)

## 2019-05-10 MED ORDER — SODIUM BICARBONATE 650 MG PO TABS
650.0000 mg | ORAL_TABLET | Freq: Two times a day (BID) | ORAL | Status: DC
  Administered 2019-05-10 – 2019-05-11 (×3): 650 mg via ORAL
  Filled 2019-05-10 (×3): qty 1

## 2019-05-10 NOTE — Progress Notes (Signed)
PROGRESS NOTE    Jorge Welch  LXB:[id number] DOB: [birth year] DOA: 05/04/2019 PCP: Patient, No Pcp Per   Brief Narrative: Patient is a 49 year old male with history of diabetes type 2, GERD, recurrent kidney stones, hypertension, left renal mass status post left nephrectomy with solitary right kidney presents to the emergency department on 3/16 for the evaluation for acute renal failure secondary to obstructive uropathy.  He was found to have distal ureteral stones and hydronephrosis.  Urology consulted and he underwent right cystoscopy with right ureteral stent placement and urethral dilatation.  Nephrology following.  Started on hemodialysis here after placement of temporary dialysis catheter.  Currently anticipating spontaneous renal recovery.  Assessment & Plan:   Principal Problem:   Acute renal failure (ARF) (HCC) Active Problems:   Diabetes ([place])   Essential hypertension   Hyperlipidemia   Acute pyelonephritis   AKI: Baseline creatinine at 1.5.  He has solitary right kidney.  Etiology of AKI was suspected to be obstruction versus pyelonephritis.  He was also on ACE inhibitor's.  He was dialyzed once on 3/20.  Currently having good urine output.  Further hemodialysis on hold.  Anticipating spontaneous renal recovery.  Pyelonephritis: Associated with ureteral obstruction due to stone.  Urine culture showed E. coli.  On IV antibiotics.  On day 7 of Rocephin, day 5 doxycycline.  Blood culture negative.  Will stop antibiotics today  Distal ureteral stone: Status post ureteral stent on 3/16.  Plan for stone extraction as an outpatient by urology.  He still has Foley catheter.  He will be discharged on Foley catheter.  He has history of recurrent nephrolithiasis.  Solitary right kidney: Status post left nephrectomy for urothelial carcinoma on 6/20.  He follows with Dr. Louis Meckel.  Hypertension: ACE inhibitor and thiazide on hold.  Started on gentle IV fluids by nephrology.  Diabetes  type 2: Continue Sliding scale insulin.  Metformin at home.  Brief onset of  A. fib: EKG on presentation showed A. fib with RVR.  Most likely associated with AKI, UTI, hemodynamic changes.  Currently in normal sinus rhythm.  No indication for anticoagulation.On cardizem. No H.O afib in the past.          DVT prophylaxis:Heparin Ray Code Status: Full code Family Communication: None at bedside Disposition Plan: Patient is from home.  His kidney function is still not optimal.  Discharge planning to home after clearance from nephrology   Consultants: Nephrology, urology  Procedures: As above  Antimicrobials:  Anti-infectives (From admission, onward)   Start     Dose/Rate Route Frequency Ordered Stop   05/06/19 2200  doxycycline (VIBRA-TABS) tablet 100 mg     100 mg Oral Every 12 hours 05/06/19 1608     05/05/19 2200  cefTRIAXone (ROCEPHIN) 2 g in sodium chloride 0.9 % 100 mL IVPB     2 g 200 mL/hr over 30 Minutes Intravenous Every 24 hours 05/04/19 2359     05/05/19 0230  cefTRIAXone (ROCEPHIN) 1 g in sodium chloride 0.9 % 100 mL IVPB     1 g 200 mL/hr over 30 Minutes Intravenous  Once 05/04/19 2331 05/05/19 0311   05/04/19 2030  cefTRIAXone (ROCEPHIN) 1 g in sodium chloride 0.9 % 100 mL IVPB     1 g 200 mL/hr over 30 Minutes Intravenous  Once 05/04/19 2018 05/04/19 2110      Subjective:  Patient seen and examined at the bedside this morning.  Hemodynamically stable.  Making good urine output.  Denies any complaints.  Eager  to go home  Objective: Vitals:   05/09/19 0901 05/09/19 1703 05/09/19 2136 05/10/19 0855  BP: 138/90 (!) 141/94 (!) 155/87 (!) 164/89  Pulse: 90 88 86 87  Resp: 18 20 16 18   Temp: 98 F (36.7 C) 99.5 F (37.5 C) 99.4 F (37.4 C)   TempSrc: Oral Oral Oral   SpO2: 96% 96% 99% 100%  Weight:      Height:        Intake/Output Summary (Last 24 hours) at 05/10/2019 1159 Last data filed at 05/10/2019 0600 Gross per 24 hour  Intake 800 ml  Output 2675  ml  Net -1875 ml   Filed Weights   05/08/19 0500 05/08/19 2128 05/09/19 0325  Weight: 89.7 kg 84.3 kg 82.3 kg    Examination:  General exam: Appears calm and comfortable ,Not in distress,average built HEENT:PERRL,Oral mucosa moist, Ear/Nose normal on gross exam.  Temporary dialysis catheter on the right neck Respiratory system: Bilateral equal air entry, normal vesicular breath sounds, no wheezes or crackles  Cardiovascular system: S1 & S2 heard, RRR. No JVD, murmurs, rubs, gallops or clicks. No pedal edema. Gastrointestinal system: Abdomen is nondistended, soft and nontender. No organomegaly or masses felt. Normal bowel sounds heard. Central nervous system: Alert and oriented. No focal neurological deficits. Extremities: No edema, no clubbing ,no cyanosis Skin: No rashes, lesions or ulcers,no icterus ,no pallor   Data Reviewed: I have personally reviewed following labs and imaging studies  CBC: Recent Labs  Lab 05/04/19 1827 05/05/19 0127 05/06/19 0441 05/07/19 0430 05/08/19 0449 05/09/19 0429 05/10/19 0410  WBC 17.9*   < > 11.4* 8.7 7.9 8.7 8.0  NEUTROABS 16.5*  --   --   --   --  6.7 6.0  HGB 14.7   < > 11.6* 11.2* 11.4* 12.1* 10.3*  HCT 41.8   < > 34.4* 33.3* 33.9* 34.0* 30.1*  MCV 91.7   < > 94.0 94.6 91.4 87.6 90.1  PLT 160   < > 113* 120* 123* 159 158   < > = values in this interval not displayed.   Basic Metabolic Panel: Recent Labs  Lab 05/06/19 0441 05/06/19 1618 05/07/19 0430 05/07/19 0434 05/08/19 0449 05/09/19 0429 05/10/19 0410  NA 136  --  133*  --  134* 134* 134*  K 4.8  --  5.0  --  4.3 3.0* 3.6  CL 106  --  104  --  103 98 101  CO2 18*  --  16*  --  16* 21* 19*  GLUCOSE 118*  --  113*  --  154* 181* 137*  BUN 77*  --  96*  --  98* 60* 72*  CREATININE 7.85*  --  8.86*  --  9.08* 5.37* 5.26*  CALCIUM 7.7*  --  7.9*  --  8.6* 8.3* 8.3*  MG  --  1.3*  --  2.4  --   --   --    GFR: Estimated Creatinine Clearance: 16.7 mL/min (A) (by C-G  formula based on SCr of 5.26 mg/dL (H)). Liver Function Tests: Recent Labs  Lab 05/04/19 1826 05/05/19 0127 05/08/19 0449  AST 24 22 18   ALT 23 21 20   ALKPHOS 61 42 89  BILITOT 2.1* 1.4* 0.6  PROT 7.3 5.5* 5.3*  ALBUMIN 4.2 3.1* 2.2*   Recent Labs  Lab 05/04/19 1826  LIPASE 23   No results for input(s): AMMONIA in the last 168 hours. Coagulation Profile: No results for input(s): INR, PROTIME in the last  168 hours. Cardiac Enzymes: Recent Labs  Lab 05/04/19 1826  CKTOTAL 107   BNP (last 3 results) No results for input(s): PROBNP in the last 8760 hours. HbA1C: No results for input(s): HGBA1C in the last 72 hours. CBG: Recent Labs  Lab 05/04/19 2308 05/08/19 2116 05/09/19 0640  GLUCAP 119* 133* 174*   Lipid Profile: No results for input(s): CHOL, HDL, LDLCALC, TRIG, CHOLHDL, LDLDIRECT in the last 72 hours. Thyroid Function Tests: No results for input(s): TSH, T4TOTAL, FREET4, T3FREE, THYROIDAB in the last 72 hours. Anemia Panel: No results for input(s): VITAMINB12, FOLATE, FERRITIN, TIBC, IRON, RETICCTPCT in the last 72 hours. Sepsis Labs: No results for input(s): PROCALCITON, LATICACIDVEN in the last 168 hours.  Recent Results (from the past 240 hour(s))  Respiratory Panel by RT PCR (Flu A&B, Covid) - Nasopharyngeal Swab     Status: None   Collection Time: 05/04/19  8:33 PM   Specimen: Nasopharyngeal Swab  Result Value Ref Range Status   SARS Coronavirus 2 by RT PCR NEGATIVE NEGATIVE Final    Comment: (NOTE) SARS-CoV-2 target nucleic acids are NOT DETECTED. The SARS-CoV-2 RNA is generally detectable in upper respiratoy specimens during the acute phase of infection. The lowest concentration of SARS-CoV-2 viral copies this assay can detect is 131 copies/mL. A negative result does not preclude SARS-Cov-2 infection and should not be used as the sole basis for treatment or other patient management decisions. A negative result may occur with  improper specimen  collection/handling, submission of specimen other than nasopharyngeal swab, presence of viral mutation(s) within the areas targeted by this assay, and inadequate number of viral copies (<131 copies/mL). A negative result must be combined with clinical observations, patient history, and epidemiological information. The expected result is Negative. Fact Sheet for Patients:  PinkCheek.be Fact Sheet for Healthcare Providers:  GravelBags.it This test is not yet ap proved or cleared by the [place] FDA and  has been authorized for detection and/or diagnosis of SARS-CoV-2 by FDA under an Emergency Use Authorization (EUA). This EUA will remain  in effect (meaning this test can be used) for the duration of the COVID-19 declaration under Section 564(b)(1) of the Act, 21 U.S.C. section 360bbb-3(b)(1), unless the authorization is terminated or revoked sooner.    Influenza A by PCR NEGATIVE NEGATIVE Final   Influenza B by PCR NEGATIVE NEGATIVE Final    Comment: (NOTE) The Xpert Xpress SARS-CoV-2/FLU/RSV assay is intended as an aid in  the diagnosis of influenza from Nasopharyngeal swab specimens and  should not be used as a sole basis for treatment. Nasal washings and  aspirates are unacceptable for Xpert Xpress SARS-CoV-2/FLU/RSV  testing. Fact Sheet for Patients: PinkCheek.be Fact Sheet for Healthcare Providers: GravelBags.it This test is not yet approved or cleared by the [place] FDA and  has been authorized for detection and/or diagnosis of SARS-CoV-2 by  FDA under an Emergency Use Authorization (EUA). This EUA will remain  in effect (meaning this test can be used) for the duration of the  Covid-19 declaration under Section 564(b)(1) of the Act, 21  U.S.C. section 360bbb-3(b)(1), unless the authorization is  terminated or revoked. Performed at [facility], [place] [address] [place] [address], [place], [place] [address]   Urine Culture     Status: Abnormal   Collection Time: 05/04/19 10:53 PM   Specimen: Urine, Cystoscope  Result Value Ref Range Status   Specimen Description   Final    URINE, CLEAN CATCH Performed at [facility], [place]  [address] [place], [place] [address]    Special Requests   Final    CYSTOSCOPE Performed at [facility], [place] [address] [place], [place] [address]    Culture >=100,000 COLONIES/mL ESCHERICHIA COLI (A)  Final   Report Status 05/07/2019 FINAL  Final   Organism ID, Bacteria ESCHERICHIA COLI (A)  Final      Susceptibility   Escherichia coli - MIC*    AMPICILLIN <=2 SENSITIVE Sensitive     CEFAZOLIN <=4 SENSITIVE Sensitive     CEFTRIAXONE <=0.25 SENSITIVE Sensitive     CIPROFLOXACIN <=0.25 SENSITIVE Sensitive     GENTAMICIN <=1 SENSITIVE Sensitive     IMIPENEM <=0.25 SENSITIVE Sensitive     NITROFURANTOIN <=16 SENSITIVE Sensitive     TRIMETH/SULFA <=20 SENSITIVE Sensitive     AMPICILLIN/SULBACTAM <=2 SENSITIVE Sensitive     PIP/TAZO <=4 SENSITIVE Sensitive     * >=100,000 COLONIES/mL ESCHERICHIA COLI  Culture, blood (routine x 2)     Status: None (Preliminary result)   Collection Time: 05/05/19  1:27 AM   Specimen: BLOOD  Result Value Ref Range Status   Specimen Description   Final    BLOOD RIGHT ANTECUBITAL Performed at [place] [address], [place], [place] [address]    Special Requests   Final    BOTTLES DRAWN AEROBIC AND ANAEROBIC Blood Culture adequate volume Performed at [place] [address], [place], [place] [address]    Culture   Final    NO GROWTH 4 DAYS Performed at [facility], [place] [address], [place], [place] [address]    Report Status PENDING  Incomplete  Culture, blood (routine x 2)     Status: None (Preliminary result)   Collection Time: 05/05/19  1:27 AM   Specimen: BLOOD RIGHT HAND    Result Value Ref Range Status   Specimen Description   Final    BLOOD RIGHT HAND Performed at [facility], [place] [address], [place], [place] [address]    Special Requests   Final    BOTTLES DRAWN AEROBIC ONLY Blood Culture adequate volume Performed at [place] [address] [place], [place] [address]    Culture   Final    NO GROWTH 4 DAYS Performed at [facility], [place] [address], [place], [place] [address]    Report Status PENDING  Incomplete         Radiology Studies: No results found.      Scheduled Meds: . atorvastatin  20 mg Oral BH-q7a  . Chlorhexidine Gluconate Cloth  6 each Topical Q0600  . diltiazem  120 mg Oral Daily  . doxycycline  100 mg Oral Q12H  . heparin  5,000 Units Subcutaneous Q8H  . metoprolol tartrate  5 mg Intravenous Once  . polyethylene glycol  17 g Oral Daily  . sodium chloride flush  10-40 mL Intracatheter Q12H   Continuous Infusions: . cefTRIAXone (ROCEPHIN)  IV Stopped (05/09/19 2240)     LOS: 6 days    Time spent: 25 mins.More than 50% of that time was spent in counseling and/or coordination of care.      Shelly Coss, MD [facility] P3/22/2021, 11:59 AM

## 2019-05-10 NOTE — Plan of Care (Signed)
  Problem: Activity: Goal: Risk for activity intolerance will decrease Outcome: Progressing   Problem: Nutrition: Goal: Adequate nutrition will be maintained Outcome: Progressing   

## 2019-05-10 NOTE — Progress Notes (Signed)
[facility] Progress Note  Subjective: 3.6 L uop yest ! Off diuretics.  Appetite improved  Vitals:   05/09/19 0901 05/09/19 1703 05/09/19 2136 05/10/19 0855  BP: 138/90 (!) 141/94 (!) 155/87 (!) 164/89  Pulse: 90 88 86 87  Resp: 18 20 16 18   Temp: 98 F (36.7 C) 99.5 F (37.5 C) 99.4 F (37.4 C)   TempSrc: Oral Oral Oral   SpO2: 96% 96% 99% 100%  Weight:      Height:        Exam: R IJ temp cath   Alert, no distress, sitting in bed   No jvd  Chest clear bilat   Cor reg no RG   Abd soft ntnd no ascites    Ext no edema    NF Ox 3, no asterixis   54M with solitary kidney presenting with obstructing R distal ureteral stone with complication of pyelonephritis; now with AKI  Assessment/ Plan 1. AKI - BL SCr 1.5 w/ solitary kidney. Etiology including obstruction+ pyelo + ACEi. Had HD x1 3/20 (Sat) for ^B/ Cr 98/ 9.1. Labs stable compared to yesterday, now w/ large UOP, will hold on further HD, get labs again in am. Should recover now that obstruction relieved. Volume status appears euvolemic.  Drink to thirst, d/c IVF for now.  2. Pyelonephritis- w/ ureteral obstruction d/t stone.  EColi by UCx , Bcx's negative, on IV abx 3. Distal ureteral stone - status post ureteral stent on 3/16. For stone extraction at a later date.  4. 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 HTN/volume - hold ACEi/ thiazide for now. Vol excess resolved, down to admit wt. Drink to thirst.  6. DM2 - Metformin on hold 7. Metabolic acidosis - secondary to AKI, start po bicarb.   I think if his labs/volume status are stable tomorrow he will be ok for discharge from renal perspective with close outpt labs/follow up.  Will see him tomorrow.    Jannifer Hick MD [facility] Pager [phone]   Recent Labs  Lab 05/09/19 0429 05/10/19 0410  K 3.0* 3.6  BUN 60* 72*  CREATININE 5.37* 5.26*  CALCIUM 8.3* 8.3*  HGB 12.1* 10.3*   Inpatient  medications: . atorvastatin  20 mg Oral BH-q7a  . Chlorhexidine Gluconate Cloth  6 each Topical Q0600  . diltiazem  120 mg Oral Daily  . doxycycline  100 mg Oral Q12H  . heparin  5,000 Units Subcutaneous Q8H  . metoprolol tartrate  5 mg Intravenous Once  . polyethylene glycol  17 g Oral Daily  . sodium chloride flush  10-40 mL Intracatheter Q12H   . cefTRIAXone (ROCEPHIN)  IV Stopped (05/09/19 2240)   acetaminophen **OR** acetaminophen, albuterol, calcium carbonate (dosed in mg elemental calcium), camphor-menthol **AND** hydrOXYzine, docusate sodium, feeding supplement (NEPRO CARB STEADY), HYDROcodone-acetaminophen, lidocaine, ondansetron **OR** ondansetron (ZOFRAN) IV, senna, sodium chloride flush, sorbitol

## 2019-05-11 LAB — CBC WITH DIFFERENTIAL/PLATELET
Abs Immature Granulocytes: 0.43 10*3/uL — ABNORMAL HIGH (ref 0.00–0.07)
Basophils Absolute: 0 10*3/uL (ref 0.0–0.1)
Basophils Relative: 0 %
Eosinophils Absolute: 0.2 10*3/uL (ref 0.0–0.5)
Eosinophils Relative: 2 %
HCT: 30.7 % — ABNORMAL LOW (ref 39.0–52.0)
Hemoglobin: 10.4 g/dL — ABNORMAL LOW (ref 13.0–17.0)
Immature Granulocytes: 4 %
Lymphocytes Relative: 10 %
Lymphs Abs: 1 10*3/uL (ref 0.7–4.0)
MCH: 31.3 pg (ref 26.0–34.0)
MCHC: 33.9 g/dL (ref 30.0–36.0)
MCV: 92.5 fL (ref 80.0–100.0)
Monocytes Absolute: 0.7 10*3/uL (ref 0.1–1.0)
Monocytes Relative: 7 %
Neutro Abs: 7.4 10*3/uL (ref 1.7–7.7)
Neutrophils Relative %: 77 %
Platelets: 196 10*3/uL (ref 150–400)
RBC: 3.32 MIL/uL — ABNORMAL LOW (ref 4.22–5.81)
RDW: 13.1 % (ref 11.5–15.5)
WBC: 9.7 10*3/uL (ref 4.0–10.5)
nRBC: 0 % (ref 0.0–0.2)

## 2019-05-11 LAB — BASIC METABOLIC PANEL
Anion gap: 11 (ref 5–15)
BUN: 76 mg/dL — ABNORMAL HIGH (ref 6–20)
CO2: 21 mmol/L — ABNORMAL LOW (ref 22–32)
Calcium: 8.6 mg/dL — ABNORMAL LOW (ref 8.9–10.3)
Chloride: 103 mmol/L (ref 98–111)
Creatinine, Ser: 4.61 mg/dL — ABNORMAL HIGH (ref 0.61–1.24)
GFR calc Af Amer: 16 mL/min — ABNORMAL LOW (ref >60)
GFR calc non Af Amer: 14 mL/min — ABNORMAL LOW (ref >60)
Glucose, Bld: 137 mg/dL — ABNORMAL HIGH (ref 70–99)
Potassium: 4 mmol/L (ref 3.5–5.1)
Sodium: 135 mmol/L (ref 135–145)

## 2019-05-11 LAB — GLUCOSE, CAPILLARY: Glucose-Capillary: 116 mg/dL — ABNORMAL HIGH (ref 70–99)

## 2019-05-11 MED ORDER — DILTIAZEM HCL ER COATED BEADS 120 MG PO CP24: 120.0000 mg | ORAL_CAPSULE | Freq: Every day | ORAL | 1 refills | Status: AC

## 2019-05-11 MED ORDER — GLIPIZIDE 5 MG PO TABS: 2.5000 mg | ORAL_TABLET | Freq: Every day | ORAL | 1 refills | Status: AC

## 2019-05-11 MED ORDER — HYDRALAZINE HCL 25 MG PO TABS: 25.0000 mg | ORAL_TABLET | Freq: Three times a day (TID) | ORAL | 1 refills | Status: AC

## 2019-05-11 MED ORDER — SODIUM BICARBONATE 650 MG PO TABS: 650.0000 mg | ORAL_TABLET | Freq: Two times a day (BID) | ORAL | 1 refills | Status: AC

## 2019-05-11 NOTE — TOC Initial Note (Signed)
Transition of Care [facility]) - Initial/Assessment Note    Patient Details  Name: Jorge Welch MRN: [id number] Date of Birth: 1971/01/25  Transition of Care [facility]) CM/SW Contact:    Bartholomew Crews, RN Phone Number: [phone] 05/11/2019, 1:59 PM  Clinical Narrative:                 Patient to transition home today. Spoke with patient at the bedside to discuss obtaining PCP. Patient advised that he is established with Dr. Pleas Koch at [facility] in [place]. Epic updated. No further TOC needs identified.  Expected Discharge Plan: Home/Self Care Barriers to Discharge: No Barriers Identified   Patient Goals and CMS Choice   CMS Medicare.gov Compare Post Acute Care list provided to:: Patient Choice offered to / list presented to : NA  Expected Discharge Plan and Services Expected Discharge Plan: Home/Self Care In-house Referral: NA Discharge Planning Services: CM Consult Post Acute Care Choice: NA Living arrangements for the past 2 months: Single Family Home Expected Discharge Date: 05/11/19               DME Arranged: N/A DME Agency: NA       HH Arranged: NA [place] Agency: NA        Prior Living Arrangements/Services Living arrangements for the past 2 months: Single Family Home Lives with:: Self, Spouse Patient language and need for interpreter reviewed:: Yes              Criminal Activity/Legal Involvement Pertinent to Current Situation/Hospitalization: No - Comment as needed  Activities of Daily Living Home Assistive Devices/Equipment: Eyeglasses ADL Screening (condition at time of admission) Patient's cognitive ability adequate to safely complete daily activities?: Yes Is the patient deaf or have difficulty hearing?: No Does the patient have difficulty seeing, even when wearing glasses/contacts?: No Does the patient have difficulty concentrating, remembering, or making decisions?: No Patient able to express need for assistance with ADLs?: Yes Does the patient have  difficulty dressing or bathing?: No Independently performs ADLs?: Yes (appropriate for developmental age) Does the patient have difficulty walking or climbing stairs?: No Weakness of Legs: None Weakness of Arms/Hands: None  Permission Sought/Granted                  Emotional Assessment Appearance:: Appears stated age Attitude/Demeanor/Rapport: Engaged Affect (typically observed): Accepting Orientation: : Oriented to Self, Oriented to  Time, Oriented to Place, Oriented to Situation Alcohol / Substance Use: Not Applicable Psych Involvement: No (comment)  Admission diagnosis:  Ureterolithiasis [N20.1] ARF (acute renal failure) (HCC) [N17.9] Right kidney stone [N20.0] Acute renal failure (ARF) (HCC) [N17.9] Patient Active Problem List   Diagnosis Date Noted  . Hyperlipidemia 05/05/2019  . Acute pyelonephritis 05/05/2019  . ARF (acute renal failure) ([place]) 05/04/2019  . Acute renal failure (ARF) ([place]) 05/04/2019  . Diabetes ([place]) 05/04/2019  . Essential hypertension 05/04/2019  . Renal mass 07/23/2018   PCP:  Curlene Labrum, MD Pharmacy:   [facility] [address], [place] [address] Phone: [phone] Fax: [phone]     Social Determinants of Health (SDOH) Interventions    Readmission Risk Interventions No flowsheet data found.

## 2019-05-11 NOTE — Progress Notes (Signed)
DISCHARGE NOTE HOME Jorge Welch to be discharged Home per MD order. Discussed prescriptions and follow up appointments with the patient. Prescriptions given to patient; medication list explained in detail. Patient verbalized understanding.  Skin clean, dry and intact without evidence of skin break down, no evidence of skin tears noted. IV catheter discontinued intact. Site without signs and symptoms of complications. Dressing and pressure applied. Pt denies pain at the site currently. No complaints noted.  Patient free of lines, drains, and wounds.   An After Visit Summary (AVS) was printed and given to the patient. Patient escorted via wheelchair, and discharged home via private auto.  Arlyss Repress, RN

## 2019-05-11 NOTE — Progress Notes (Signed)
[facility] Progress Note  Subjective: 3.8 L uop yest Off diuretics.  Appetite improved/ feels good.  Ready for discharge.    Vitals:   05/10/19 2117 05/11/19 0117 05/11/19 0438 05/11/19 0946  BP: (!) 156/90  (!) 166/86 (!) 166/89  Pulse: 90  85 85  Resp: 18  16 16   Temp: 98.5 F (36.9 C)  97.8 F (36.6 C) 97.8 F (36.6 C)  TempSrc: Oral  Oral Oral  SpO2: 98%  99% 97%  Weight: 81.3 kg 81.3 kg    Height:        Exam: R IJ temp cath   Alert, no distress, sitting in bed   No jvd  Chest clear bilat   Cor reg no RG   Abd soft ntnd no ascites    Ext no edema    NF Ox 3    16M with solitary kidney presenting with obstructing R distal ureteral stone with complication of pyelonephritis; now with AKI  Assessment/ Plan 1. AKI - BL SCr 1.5 w/ solitary kidney. Etiology including obstruction+ pyelo + ACEi. Had HD x1 3/20 (Sat) for ^B/ Cr 98/ 9.1. Labs improved, now w/ large UOP; no need for further HD. Should recover now that obstruction relieved. Volume status appears euvolemic.  Drink to thirst, off IVF for now and maintaining fine.  C/s IV team to d/c HD cath.  2. Pyelonephritis- w/ ureteral obstruction d/t stone.  EColi by UCx , Bcx's negative, on IV abx > po transition per primary.  3. Distal ureteral stone - status post ureteral stent on 3/16. For stone extraction at a later date.  4. 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 HTN/volume - hold ACEi/ thiazide for now. Vol excess resolved, down to admit wt. Drink to thirst.  Add amlodipine 5 daily for d/c.  6. DM2 - Metformin on hold, would continue to hold until renal function back to baseline. Glipizide 2.5 qam would be ok oral alt.  7. Metabolic acidosis - secondary to AKI, improved on po bicarb.   Ok to d/c from my perspective.  [facility] for labs and be seen in nephrology clinic in [place] 4/5 for follow up.  He's going to call urologist for appt asap  because he's trying to go out of town to [facility] 4/12.    Jannifer Hick MD [facility] Pager [phone]   Recent Labs  Lab 05/10/19 0410 05/11/19 0400  K 3.6 4.0  BUN 72* 76*  CREATININE 5.26* 4.61*  CALCIUM 8.3* 8.6*  HGB 10.3* 10.4*   Inpatient medications: . atorvastatin  20 mg Oral BH-q7a  . Chlorhexidine Gluconate Cloth  6 each Topical Q0600  . diltiazem  120 mg Oral Daily  . heparin  5,000 Units Subcutaneous Q8H  . metoprolol tartrate  5 mg Intravenous Once  . polyethylene glycol  17 g Oral Daily  . sodium bicarbonate  650 mg Oral BID  . sodium chloride flush  10-40 mL Intracatheter Q12H    acetaminophen **OR** acetaminophen, albuterol, calcium carbonate (dosed in mg elemental calcium), camphor-menthol **AND** hydrOXYzine, docusate sodium, feeding supplement (NEPRO CARB STEADY), HYDROcodone-acetaminophen, lidocaine, ondansetron **OR** ondansetron (ZOFRAN) IV, senna, sodium chloride flush, sorbitol

## 2019-05-11 NOTE — Discharge Summary (Signed)
Physician Discharge Summary  Jorge Welch NTZ:[id number] DOB: 1971-01-09 DOA: 05/04/2019  PCP: Patient, No Pcp Per  Admit date: 05/04/2019 Discharge date: 05/11/2019  Admitted From: Home Disposition:  Home  Discharge Condition:Stable CODE STATUS:FULL Diet recommendation: Heart Healthy   Brief/Interim Summary: Patient is a 49 year old male with history of diabetes type 2, GERD, recurrent kidney stones, hypertension, left renal mass status post left nephrectomy with solitary right kidney presents to the emergency department on 3/16 for the evaluation for acute renal failure secondary to obstructive uropathy.  He was found to have distal ureteral stones and hydronephrosis.  Urology consulted and he underwent right cystoscopy with right ureteral stent placement and urethral dilatation.  Nephrology was following.  Started on hemodialysis here after placement of temporary dialysis catheter.    His kidney function has been improving without dialysis now and is making good urine output.  Nephrology cleared him for discharge and arrange outpatient follow-up appointment.  He is hemodynamically stable for discharge to home today.  Following problems were addressed during his hospitalization:  AKI: Baseline creatinine at 1.5.  He has solitary right kidney.  Etiology of AKI was suspected to be obstruction versus pyelonephritis.  He was also on ACE inhibitor's.  He was dialyzed once on 3/20.  Currently having good urine output.  Further hemodialysis on hold.    Kidney function recovering.  He will follow-up with [facility] as an outpatient.  Pyelonephritis: Associated with ureteral obstruction due to stone.  Urine culture showed E. coli. Completed antibiotics course. Blood culture negative.  Distal ureteral stone: Status post ureteral stent on 3/16.  Plan for stone extraction as an outpatient by urology.  He still has Foley catheter.  He will be discharged on Foley catheter.  He has history of  recurrent nephrolithiasis.  Solitary right kidney: Status post left nephrectomy for urothelial carcinoma on 6/20.  He follows with Dr. Louis Meckel.  Hypertension: Currently on Cardizem, started on hydralazine.  Diabetes type 2: Started on glipizide 2.5 mg daily.  Metformin stopped.  Brief onset of  A. fib: EKG on presentation showed A. fib with RVR.  Most likely associated with AKI, UTI, hemodynamic changes.  Currently in normal sinus rhythm.  No indication for anticoagulation.On cardizem. No H.O afib in the past.   Discharge Diagnoses:  Principal Problem:   Acute renal failure (ARF) ([facility]) Active Problems:   Diabetes ([place])   Essential hypertension   Hyperlipidemia   Acute pyelonephritis    Discharge Instructions  Discharge Instructions    Diet - low sodium heart healthy   Complete by: As directed    Discharge instructions   Complete by: As directed    1)Follow up with a PCP in a week.Do a BMP test during the follow up. 2)Follow up with [facility].You have an appointment set up on 4/5 at 9:30 am   Increase activity slowly   Complete by: As directed      Allergies as of 05/11/2019   No Known Allergies     Medication List    STOP taking these medications   lisinopril-hydrochlorothiazide 20-25 MG tablet Commonly known as: ZESTORETIC   metFORMIN 500 MG 24 hr tablet Commonly known as: GLUCOPHAGE-XR   sulfamethoxazole-trimethoprim 800-160 MG tablet Commonly known as: BACTRIM DS   traMADol 50 MG tablet Commonly known as: Ultram     TAKE these medications   atorvastatin 20 MG tablet Commonly known as: LIPITOR Take 20 mg by mouth every morning.   diltiazem 120 MG 24 hr capsule Commonly known  as: CARDIZEM CD Take 1 capsule (120 mg total) by mouth daily. Start taking on: May 12, 2019   diphenhydrAMINE 25 mg capsule Commonly known as: BENADRYL Take 25 mg by mouth daily as needed for allergies. Equate Allergy Relief   glipiZIDE 5 MG tablet Commonly known  as: Glucotrol Take 0.5 tablets (2.5 mg total) by mouth daily.   hydrALAZINE 25 MG tablet Commonly known as: APRESOLINE Take 1 tablet (25 mg total) by mouth 3 (three) times daily.   sodium bicarbonate 650 MG tablet Take 1 tablet (650 mg total) by mouth 2 (two) times daily.      Follow-up Information    [facility]. Schedule an appointment as soon as possible for a visit in 1 week(s).   Contact information: [place] [phone]         No Known Allergies  Consultations:  Nephrology,Urology   Procedures/Studies: CT ABDOMEN PELVIS WO CONTRAST  Result Date: 05/04/2019 CLINICAL DATA:  Right flank pain EXAM: CT ABDOMEN AND PELVIS WITHOUT CONTRAST TECHNIQUE: Multidetector CT imaging of the abdomen and pelvis was performed following the standard protocol without IV contrast. COMPARISON:  None. FINDINGS: LOWER CHEST: Normal. HEPATOBILIARY: Normal hepatic contours. No intra- or extrahepatic biliary dilatation. The gallbladder is normal. PANCREAS: Normal pancreas. No ductal dilatation or peripancreatic fluid collection. SPLEEN: Normal. ADRENALS/URINARY TRACT: The adrenal glands are normal. There is a stone within the distal right ureter measuring 4 mm, causing mild hydronephrosis and moderate hydroureter and mild perinephric stranding. Left kidney is surgically absent. No abnormality of the left renal fossa. The urinary bladder is normal for degree of distention STOMACH/BOWEL: There is no hiatal hernia. Normal duodenal course and caliber. No small bowel dilatation or inflammation. No focal colonic abnormality. Normal appendix. VASCULAR/LYMPHATIC: Normal course and caliber of the major abdominal vessels. No abdominal or pelvic lymphadenopathy. REPRODUCTIVE: Normal prostate size with symmetric seminal vesicles. MUSCULOSKELETAL. No bony spinal canal stenosis or focal osseous abnormality. OTHER: None. IMPRESSION: 1. Right obstructive  uropathy with 4 mm stone in the distal right ureter causing mild hydronephrosis and moderate hydroureter. 2. Status post left nephrectomy. Electronically Signed   By: Ulyses Jarred M.D.   On: 05/04/2019 20:05   DG Abd 1 View  Result Date: 05/05/2019 CLINICAL DATA:  Renal insufficiency right-sided stent EXAM: ABDOMEN - 1 VIEW COMPARISON:  CT 05/04/2019 FINDINGS: Mild gaseous prominence of upper small bowel without definitive obstruction. Interim placement of right-sided ureteral stent. No definitive radiopaque calculi. IMPRESSION: Placement of a right-sided ureteral stent. Nonobstructed bowel-gas pattern Electronically Signed   By: Donavan Foil M.D.   On: 05/05/2019 18:21   US RENAL  Result Date: 05/06/2019 CLINICAL DATA:  Acute kidney injury. History of left nephro ureterectomy. EXAM: RENAL / URINARY TRACT ULTRASOUND COMPLETE COMPARISON:  06/16/2018 FINDINGS: Right Kidney: Renal measurements: 13.7 x 6.4 x 7.3 cm = volume: 335 mL . Echogenicity within normal limits. No mass or hydronephrosis visualized. Left Kidney: Surgically absent. Bladder: Decompressed by Foley catheter. Other: None. IMPRESSION: 1. Unremarkable right kidney.  Specifically, no hydronephrosis. 2. Left kidney surgically absent. Electronically Signed   By: Misty Stanley M.D.   On: 05/06/2019 08:50   IR Fluoro Guide CV Line Right  Result Date: 05/07/2019 CLINICAL DATA:  Acute renal failure, needs access for planned dialysis EXAM: EXAM RIGHT IJ CATHETER PLACEMENT UNDER ULTRASOUND AND FLUOROSCOPIC GUIDANCE TECHNIQUE: The procedure, risks (including but not limited to bleeding, infection, organ damage, pneumothorax), benefits, and alternatives were explained to  the patient. Questions regarding the procedure were encouraged and answered. The patient understands and consents to the procedure. Patency of the right IJ vein was confirmed with ultrasound with image documentation. An appropriate skin site was determined. Skin site was marked.  Region was prepped using maximum barrier technique including cap and mask, sterile gown, sterile gloves, large sterile sheet, and Chlorhexidine as cutaneous antisepsis. The region was infiltrated locally with 1% lidocaine. Under real-time ultrasound guidance, the right IJ vein was accessed with a 19 gauge needle; the needle tip within the vein was confirmed with ultrasound image documentation. The needle exchanged over a guidewire for vascular dilator which allowed advancement of a 20 cm Trialysis catheter. This was positioned with the tip at the cavoatrial junction. Spot chest radiograph shows good positioning and no pneumothorax. Catheter was flushed and sutured externally with 0-Prolene sutures. Patient tolerated the procedure well. FLUOROSCOPY TIME:  0.1 minute COMPLICATIONS: COMPLICATIONS none IMPRESSION: 1. Technically successful right IJ Trialysis catheter placement. Electronically Signed   By: Lucrezia Europe M.D.   On: 05/07/2019 16:26   IR US Guide Vasc Access Right  Result Date: 05/07/2019 CLINICAL DATA:  Acute renal failure, needs access for planned dialysis EXAM: EXAM RIGHT IJ CATHETER PLACEMENT UNDER ULTRASOUND AND FLUOROSCOPIC GUIDANCE TECHNIQUE: The procedure, risks (including but not limited to bleeding, infection, organ damage, pneumothorax), benefits, and alternatives were explained to the patient. Questions regarding the procedure were encouraged and answered. The patient understands and consents to the procedure. Patency of the right IJ vein was confirmed with ultrasound with image documentation. An appropriate skin site was determined. Skin site was marked. Region was prepped using maximum barrier technique including cap and mask, sterile gown, sterile gloves, large sterile sheet, and Chlorhexidine as cutaneous antisepsis. The region was infiltrated locally with 1% lidocaine. Under real-time ultrasound guidance, the right IJ vein was accessed with a 19 gauge needle; the needle tip within the  vein was confirmed with ultrasound image documentation. The needle exchanged over a guidewire for vascular dilator which allowed advancement of a 20 cm Trialysis catheter. This was positioned with the tip at the cavoatrial junction. Spot chest radiograph shows good positioning and no pneumothorax. Catheter was flushed and sutured externally with 0-Prolene sutures. Patient tolerated the procedure well. FLUOROSCOPY TIME:  0.1 minute COMPLICATIONS: COMPLICATIONS none IMPRESSION: 1. Technically successful right IJ Trialysis catheter placement. Electronically Signed   By: Lucrezia Europe M.D.   On: 05/07/2019 16:26   DG CHEST PORT 1 VIEW  Result Date: 05/06/2019 CLINICAL DATA:  Shortness of breath EXAM: PORTABLE CHEST 1 VIEW COMPARISON:  Chest CT April 29, 2010 FINDINGS: There is consolidation in the medial right base region. There is subtle ill-defined opacity in each upper lobe. Lungs elsewhere clear. Heart size and pulmonary vascularity are normal. No adenopathy. No bone lesions. IMPRESSION: Airspace opacity medial right base with more subtle opacity in each upper lobe. Appearance consistent with pneumonia, most notable in the medial right base. Cardiac silhouette within normal limits. No adenopathy. Electronically Signed   By: Lowella Grip III M.D.   On: 05/06/2019 12:40   DG C-Arm 1-60 Min-No Report  Result Date: 05/04/2019 Fluoroscopy was utilized by the requesting physician.  No radiographic interpretation.   ECHOCARDIOGRAM COMPLETE  Result Date: 05/06/2019    ECHOCARDIOGRAM REPORT   Patient Name:   Jorge Welch Date of Exam: 05/06/2019 Medical Rec #:  [id number]         Height:       66.0 in Accession #:  [id number]        Weight:       179.8 lb Date of Birth:  September 18, 1970         BSA:          1.911 m Patient Age:    18 years          BP:           116/87 mmHg Patient Gender: M                 HR:           103 bpm. Exam Location:  Inpatient Procedure: 2D Echo Indications:    Abnormal ECG 794.31  / R94.31  History:        Patient has no prior history of Echocardiogram examinations.                 Aortic Valve Disease, Signs/Symptoms:Fever; Risk                 Factors:Diabetes and Hypertension. Tachycardia. Kidney Stones.                 Left nephrectomy. Acute ptelonephritis.  Sonographer:    Darlina Sicilian RDCS Referring Phys: [id number] [place]  1. Left ventricular ejection fraction, by estimation, is 60 to 65%. The left ventricle has normal function. The left ventricle has no regional wall motion abnormalities. There is mild concentric left ventricular hypertrophy. Indeterminate diastolic filling due to E-A fusion.  2. Right ventricular systolic function is normal. The right ventricular size is normal. There is normal pulmonary artery systolic pressure. The estimated right ventricular systolic pressure is 55.9 mmHg.  3. The mitral valve is grossly normal. Trivial mitral valve regurgitation. No evidence of mitral stenosis.  4. The aortic valve is tricuspid. Aortic valve regurgitation is not visualized. No aortic stenosis is present.  5. The inferior vena cava is normal in size with <50% respiratory variability, suggesting right atrial pressure of 8 mmHg. FINDINGS  Left Ventricle: Left ventricular ejection fraction, by estimation, is 60 to 65%. The left ventricle has normal function. The left ventricle has no regional wall motion abnormalities. The left ventricular internal cavity size was normal in size. There is  mild concentric left ventricular hypertrophy. Indeterminate diastolic filling due to E-A fusion. Right Ventricle: The right ventricular size is normal. No increase in right ventricular wall thickness. Right ventricular systolic function is normal. There is normal pulmonary artery systolic pressure. The tricuspid regurgitant velocity is 2.57 m/s, and  with an assumed right atrial pressure of 8 mmHg, the estimated right ventricular systolic pressure is 74.1 mmHg. Left Atrium: Left  atrial size was normal in size. Right Atrium: Right atrial size was normal in size. Pericardium: There is no evidence of pericardial effusion. Presence of pericardial fat pad. Mitral Valve: The mitral valve is grossly normal. Trivial mitral valve regurgitation. No evidence of mitral valve stenosis. Tricuspid Valve: The tricuspid valve is grossly normal. Tricuspid valve regurgitation is trivial. No evidence of tricuspid stenosis. Aortic Valve: The aortic valve is tricuspid. Aortic valve regurgitation is not visualized. No aortic stenosis is present. Pulmonic Valve: The pulmonic valve was grossly normal. Pulmonic valve regurgitation is not visualized. No evidence of pulmonic stenosis. Aorta: The aortic root and ascending aorta are structurally normal, with no evidence of dilitation. Venous: The inferior vena cava is normal in size with less than 50% respiratory variability, suggesting right atrial pressure of 8 mmHg. IAS/Shunts: No atrial level shunt detected by  color flow Doppler. EKG: Rhythm strip during this exam demostrated sinus tachycardia.  LEFT VENTRICLE PLAX 2D LVIDd:         3.90 cm LVIDs:         2.70 cm LV PW:         1.30 cm LV IVS:        1.20 cm LVOT diam:     2.00 cm LV SV:         52 LV SV Index:   27 LVOT Area:     3.14 cm  RIGHT VENTRICLE RV S prime:     17.70 cm/s TAPSE (M-mode): 2.4 cm LEFT ATRIUM             Index       RIGHT ATRIUM          Index LA diam:        3.30 cm 1.73 cm/m  RA Area:     8.83 cm LA Vol (A2C):   58.8 ml 30.76 ml/m RA Volume:   15.50 ml 8.11 ml/m LA Vol (A4C):   33.6 ml 17.58 ml/m LA Biplane Vol: 46.3 ml 24.22 ml/m  AORTIC VALVE LVOT Vmax:   94.20 cm/s LVOT Vmean:  71.900 cm/s LVOT VTI:    0.167 m  AORTA Ao Root diam: 3.60 cm TRICUSPID VALVE TR Peak grad:   26.4 mmHg TR Vmax:        257.00 cm/s  SHUNTS Systemic VTI:  0.17 m Systemic Diam: 2.00 cm Eleonore Chiquito MD Electronically signed by Eleonore Chiquito MD Signature Date/Time: 05/06/2019/4:19:49 PM    Final         Subjective: Patient seen and examined at the bedside this morning.  Hemodynamically stable for discharge.  Discharge Exam: Vitals:   05/11/19 0438 05/11/19 0946  BP: (!) 166/86 (!) 166/89  Pulse: 85 85  Resp: 16 16  Temp: 97.8 F (36.6 C) 97.8 F (36.6 C)  SpO2: 99% 97%   Vitals:   05/10/19 2117 05/11/19 0117 05/11/19 0438 05/11/19 0946  BP: (!) 156/90  (!) 166/86 (!) 166/89  Pulse: 90  85 85  Resp: 18  16 16   Temp: 98.5 F (36.9 C)  97.8 F (36.6 C) 97.8 F (36.6 C)  TempSrc: Oral  Oral Oral  SpO2: 98%  99% 97%  Weight: 81.3 kg 81.3 kg    Height:        General: Pt is alert, awake, not in acute distress Cardiovascular: RRR, S1/S2 +, no rubs, no gallops Respiratory: CTA bilaterally, no wheezing, no rhonchi Abdominal: Soft, NT, ND, bowel sounds + Extremities: no edema, no cyanosis    The results of significant diagnostics from this hospitalization (including imaging, microbiology, ancillary and laboratory) are listed below for reference.     Microbiology: Recent Results (from the past 240 hour(s))  Respiratory Panel by RT PCR (Flu A&B, Covid) - Nasopharyngeal Swab     Status: None   Collection Time: 05/04/19  8:33 PM   Specimen: Nasopharyngeal Swab  Result Value Ref Range Status   SARS Coronavirus 2 by RT PCR NEGATIVE NEGATIVE Final    Comment: (NOTE) SARS-CoV-2 target nucleic acids are NOT DETECTED. The SARS-CoV-2 RNA is generally detectable in upper respiratoy specimens during the acute phase of infection. The lowest concentration of SARS-CoV-2 viral copies this assay can detect is 131 copies/mL. A negative result does not preclude SARS-Cov-2 infection and should not be used as the sole basis for treatment or other patient management decisions. A negative  result may occur with  improper specimen collection/handling, submission of specimen other than nasopharyngeal swab, presence of viral mutation(s) within the areas targeted by this assay, and  inadequate number of viral copies (<131 copies/mL). A negative result must be combined with clinical observations, patient history, and epidemiological information. The expected result is Negative. Fact Sheet for Patients:  PinkCheek.be Fact Sheet for Healthcare Providers:  GravelBags.it This test is not yet ap proved or cleared by the [place] FDA and  has been authorized for detection and/or diagnosis of SARS-CoV-2 by FDA under an Emergency Use Authorization (EUA). This EUA will remain  in effect (meaning this test can be used) for the duration of the COVID-19 declaration under Section 564(b)(1) of the Act, 21 U.S.C. section 360bbb-3(b)(1), unless the authorization is terminated or revoked sooner.    Influenza A by PCR NEGATIVE NEGATIVE Final   Influenza B by PCR NEGATIVE NEGATIVE Final    Comment: (NOTE) The Xpert Xpress SARS-CoV-2/FLU/RSV assay is intended as an aid in  the diagnosis of influenza from Nasopharyngeal swab specimens and  should not be used as a sole basis for treatment. Nasal washings and  aspirates are unacceptable for Xpert Xpress SARS-CoV-2/FLU/RSV  testing. Fact Sheet for Patients: PinkCheek.be Fact Sheet for Healthcare Providers: GravelBags.it This test is not yet approved or cleared by the [place] FDA and  has been authorized for detection and/or diagnosis of SARS-CoV-2 by  FDA under an Emergency Use Authorization (EUA). This EUA will remain  in effect (meaning this test can be used) for the duration of the  Covid-19 declaration under Section 564(b)(1) of the Act, 21  U.S.C. section 360bbb-3(b)(1), unless the authorization is  terminated or revoked. Performed at [facility], [place] [address] [place], [place] [address]   Urine Culture     Status: Abnormal   Collection Time: 05/04/19 10:53 PM   Specimen: Urine,  Cystoscope  Result Value Ref Range Status   Specimen Description   Final    URINE, CLEAN CATCH Performed at [facility], [place] [address] [place], [place] [address]    Special Requests   Final    CYSTOSCOPE Performed at [place], [place] [address] [place], [place] [address]    Culture >=100,000 COLONIES/mL ESCHERICHIA COLI (A)  Final   Report Status 05/07/2019 FINAL  Final   Organism ID, Bacteria ESCHERICHIA COLI (A)  Final      Susceptibility   Escherichia coli - MIC*    AMPICILLIN <=2 SENSITIVE Sensitive     CEFAZOLIN <=4 SENSITIVE Sensitive     CEFTRIAXONE <=0.25 SENSITIVE Sensitive     CIPROFLOXACIN <=0.25 SENSITIVE Sensitive     GENTAMICIN <=1 SENSITIVE Sensitive     IMIPENEM <=0.25 SENSITIVE Sensitive     NITROFURANTOIN <=16 SENSITIVE Sensitive     TRIMETH/SULFA <=20 SENSITIVE Sensitive     AMPICILLIN/SULBACTAM <=2 SENSITIVE Sensitive     PIP/TAZO <=4 SENSITIVE Sensitive     * >=100,000 COLONIES/mL ESCHERICHIA COLI  Culture, blood (routine x 2)     Status: None   Collection Time: 05/05/19  1:27 AM   Specimen: BLOOD  Result Value Ref Range Status   Specimen Description   Final    BLOOD RIGHT ANTECUBITAL Performed at [place] [address], [place], [place] [address]    Special Requests   Final    BOTTLES DRAWN AEROBIC AND ANAEROBIC Blood Culture adequate volume Performed at [facility] [address] [place],  [place] [address]    Culture   Final    NO GROWTH 5 DAYS Performed at [facility], [place] [address] [place], [place] [address]    Report Status 05/10/2019 FINAL  Final  Culture, blood (routine x 2)     Status: None   Collection Time: 05/05/19  1:27 AM   Specimen: BLOOD RIGHT HAND  Result Value Ref Range Status   Specimen Description   Final    BLOOD RIGHT HAND Performed at [facility], [place] [address] [place], [place] [address]    Special Requests   Final     BOTTLES DRAWN AEROBIC ONLY Blood Culture adequate volume Performed at [place] [address] [place], [place] [address]    Culture   Final    NO GROWTH 5 DAYS Performed at [facility], [place] [address], [place], [place] [address]    Report Status 05/10/2019 FINAL  Final     Labs: BNP (last 3 results) No results for input(s): BNP in the last 8760 hours. Basic Metabolic Panel: Recent Labs  Lab 05/06/19 0441 05/06/19 1618 05/07/19 0430 05/07/19 0434 05/08/19 0449 05/09/19 0429 05/10/19 0410 05/11/19 0400  NA   < >  --  133*  --  134* 134* 134* 135  K   < >  --  5.0  --  4.3 3.0* 3.6 4.0  CL   < >  --  104  --  103 98 101 103  CO2   < >  --  16*  --  16* 21* 19* 21*  GLUCOSE   < >  --  113*  --  154* 181* 137* 137*  BUN   < >  --  96*  --  98* 60* 72* 76*  CREATININE   < >  --  8.86*  --  9.08* 5.37* 5.26* 4.61*  CALCIUM   < >  --  7.9*  --  8.6* 8.3* 8.3* 8.6*  MG  --  1.3*  --  2.4  --   --   --   --    < > = values in this interval not displayed.   Liver Function Tests: Recent Labs  Lab 05/04/19 1826 05/05/19 0127 05/08/19 0449  AST 24 22 18   ALT 23 21 20   ALKPHOS 61 42 89  BILITOT 2.1* 1.4* 0.6  PROT 7.3 5.5* 5.3*  ALBUMIN 4.2 3.1* 2.2*   Recent Labs  Lab 05/04/19 1826  LIPASE 23   No results for input(s): AMMONIA in the last 168 hours. CBC: Recent Labs  Lab 05/04/19 1827 05/05/19 0127 05/07/19 0430 05/08/19 0449 05/09/19 0429 05/10/19 0410 05/11/19 0400  WBC 17.9*   < > 8.7 7.9 8.7 8.0 9.7  NEUTROABS 16.5*  --   --   --  6.7 6.0 7.4  HGB 14.7   < > 11.2* 11.4* 12.1* 10.3* 10.4*  HCT 41.8   < > 33.3* 33.9* 34.0* 30.1* 30.7*  MCV 91.7   < > 94.6 91.4 87.6 90.1 92.5  PLT 160   < > 120* 123* 159 158 196   < > = values in this interval not displayed.   Cardiac Enzymes: Recent Labs  Lab 05/04/19 1826  CKTOTAL 107   BNP: Invalid input(s): POCBNP CBG: Recent Labs  Lab 05/04/19 2308 05/08/19 2116  05/09/19 0640 05/10/19 2116 05/11/19 0643  GLUCAP 119* 133* 174* 128* 116*   D-Dimer No results for input(s): DDIMER in  the last 72 hours. Hgb A1c No results for input(s): HGBA1C in the last 72 hours. Lipid Profile No results for input(s): CHOL, HDL, LDLCALC, TRIG, CHOLHDL, LDLDIRECT in the last 72 hours. Thyroid function studies No results for input(s): TSH, T4TOTAL, T3FREE, THYROIDAB in the last 72 hours.  Invalid input(s): FREET3 Anemia work up No results for input(s): VITAMINB12, FOLATE, FERRITIN, TIBC, IRON, RETICCTPCT in the last 72 hours. Urinalysis    Component Value Date/Time   COLORURINE RED (A) 05/06/2019 1000   APPEARANCEUR CLOUDY (A) 05/06/2019 1000   LABSPEC 1.016 05/06/2019 1000   PHURINE 6.0 05/06/2019 1000   GLUCOSEU 50 (A) 05/06/2019 1000   HGBUR LARGE (A) 05/06/2019 1000   BILIRUBINUR NEGATIVE 05/06/2019 1000   KETONESUR 5 (A) 05/06/2019 1000   PROTEINUR >=300 (A) 05/06/2019 1000   NITRITE NEGATIVE 05/06/2019 1000   LEUKOCYTESUR MODERATE (A) 05/06/2019 1000   Sepsis Labs Invalid input(s): PROCALCITONIN,  WBC,  LACTICIDVEN Microbiology Recent Results (from the past 240 hour(s))  Respiratory Panel by RT PCR (Flu A&B, Covid) - Nasopharyngeal Swab     Status: None   Collection Time: 05/04/19  8:33 PM   Specimen: Nasopharyngeal Swab  Result Value Ref Range Status   SARS Coronavirus 2 by RT PCR NEGATIVE NEGATIVE Final    Comment: (NOTE) SARS-CoV-2 target nucleic acids are NOT DETECTED. The SARS-CoV-2 RNA is generally detectable in upper respiratoy specimens during the acute phase of infection. The lowest concentration of SARS-CoV-2 viral copies this assay can detect is 131 copies/mL. A negative result does not preclude SARS-Cov-2 infection and should not be used as the sole basis for treatment or other patient management decisions. A negative result may occur with  improper specimen collection/handling, submission of specimen other than nasopharyngeal  swab, presence of viral mutation(s) within the areas targeted by this assay, and inadequate number of viral copies (<131 copies/mL). A negative result must be combined with clinical observations, patient history, and epidemiological information. The expected result is Negative. Fact Sheet for Patients:  PinkCheek.be Fact Sheet for Healthcare Providers:  GravelBags.it This test is not yet ap proved or cleared by the [place] FDA and  has been authorized for detection and/or diagnosis of SARS-CoV-2 by FDA under an Emergency Use Authorization (EUA). This EUA will remain  in effect (meaning this test can be used) for the duration of the COVID-19 declaration under Section 564(b)(1) of the Act, 21 U.S.C. section 360bbb-3(b)(1), unless the authorization is terminated or revoked sooner.    Influenza A by PCR NEGATIVE NEGATIVE Final   Influenza B by PCR NEGATIVE NEGATIVE Final    Comment: (NOTE) The Xpert Xpress SARS-CoV-2/FLU/RSV assay is intended as an aid in  the diagnosis of influenza from Nasopharyngeal swab specimens and  should not be used as a sole basis for treatment. Nasal washings and  aspirates are unacceptable for Xpert Xpress SARS-CoV-2/FLU/RSV  testing. Fact Sheet for Patients: PinkCheek.be Fact Sheet for Healthcare Providers: GravelBags.it This test is not yet approved or cleared by the [place] FDA and  has been authorized for detection and/or diagnosis of SARS-CoV-2 by  FDA under an Emergency Use Authorization (EUA). This EUA will remain  in effect (meaning this test can be used) for the duration of the  Covid-19 declaration under Section 564(b)(1) of the Act, 21  U.S.C. section 360bbb-3(b)(1), unless the authorization is  terminated or revoked. Performed at [facility], [place] [address], [place], [place] [address]   Urine  Culture     Status:  Abnormal   Collection Time: 05/04/19 10:53 PM   Specimen: Urine, Cystoscope  Result Value Ref Range Status   Specimen Description   Final    URINE, CLEAN CATCH Performed at [facility], [place] [address], [place], [place] [address]    Special Requests   Final    CYSTOSCOPE Performed at [facility], [place] [address], [place], [place] [address]    Culture >=100,000 COLONIES/mL ESCHERICHIA COLI (A)  Final   Report Status 05/07/2019 FINAL  Final   Organism ID, Bacteria ESCHERICHIA COLI (A)  Final      Susceptibility   Escherichia coli - MIC*    AMPICILLIN <=2 SENSITIVE Sensitive     CEFAZOLIN <=4 SENSITIVE Sensitive     CEFTRIAXONE <=0.25 SENSITIVE Sensitive     CIPROFLOXACIN <=0.25 SENSITIVE Sensitive     GENTAMICIN <=1 SENSITIVE Sensitive     IMIPENEM <=0.25 SENSITIVE Sensitive     NITROFURANTOIN <=16 SENSITIVE Sensitive     TRIMETH/SULFA <=20 SENSITIVE Sensitive     AMPICILLIN/SULBACTAM <=2 SENSITIVE Sensitive     PIP/TAZO <=4 SENSITIVE Sensitive     * >=100,000 COLONIES/mL ESCHERICHIA COLI  Culture, blood (routine x 2)     Status: None   Collection Time: 05/05/19  1:27 AM   Specimen: BLOOD  Result Value Ref Range Status   Specimen Description   Final    BLOOD RIGHT ANTECUBITAL Performed at [place] [address] [place], [place] [address]    Special Requests   Final    BOTTLES DRAWN AEROBIC AND ANAEROBIC Blood Culture adequate volume Performed at [place] [address], [place], [place] [address]    Culture   Final    NO GROWTH 5 DAYS Performed at [facility], [place] [address] [place], [place] [address]    Report Status 05/10/2019 FINAL  Final  Culture, blood (routine x 2)     Status: None   Collection Time: 05/05/19  1:27 AM   Specimen: BLOOD RIGHT HAND  Result Value Ref Range Status   Specimen Description   Final    BLOOD RIGHT HAND Performed at [facility], [place] [address], [place], [place] [address]    Special Requests   Final    BOTTLES DRAWN AEROBIC ONLY Blood Culture adequate volume Performed at [place] [address] [place], [place] [address]    Culture   Final    NO GROWTH 5 DAYS Performed at [facility], [place] [address] [place], [place] [address]    Report Status 05/10/2019 FINAL  Final    Please note: You were cared for by a hospitalist during your hospital stay. Once you are discharged, your primary care physician will handle any further medical issues. Please note that NO REFILLS for any discharge medications will be authorized once you are discharged, as it is imperative that you return to your primary care physician (or establish a relationship with a primary care physician if you do not have one) for your post hospital discharge needs so that they can reassess your need for medications and monitor your lab values.    Time coordinating discharge: 40 minutes  SIGNED:   Shelly Coss, MD  [facility] 05/11/2019, 1:29 PM Pager [phone]  If 7PM-7AM, please contact night-coverage [email or web address] Password [email or web address]

## 2020-11-26 IMAGING — DX DG ABDOMEN 1V
1 series · 1 of 1 positions shown · non-contrast
Comparison: CT 05/04/2019

CLINICAL DATA: Renal insufficiency right-sided stent

EXAM:
ABDOMEN - 1 VIEW

[abdomen kub]
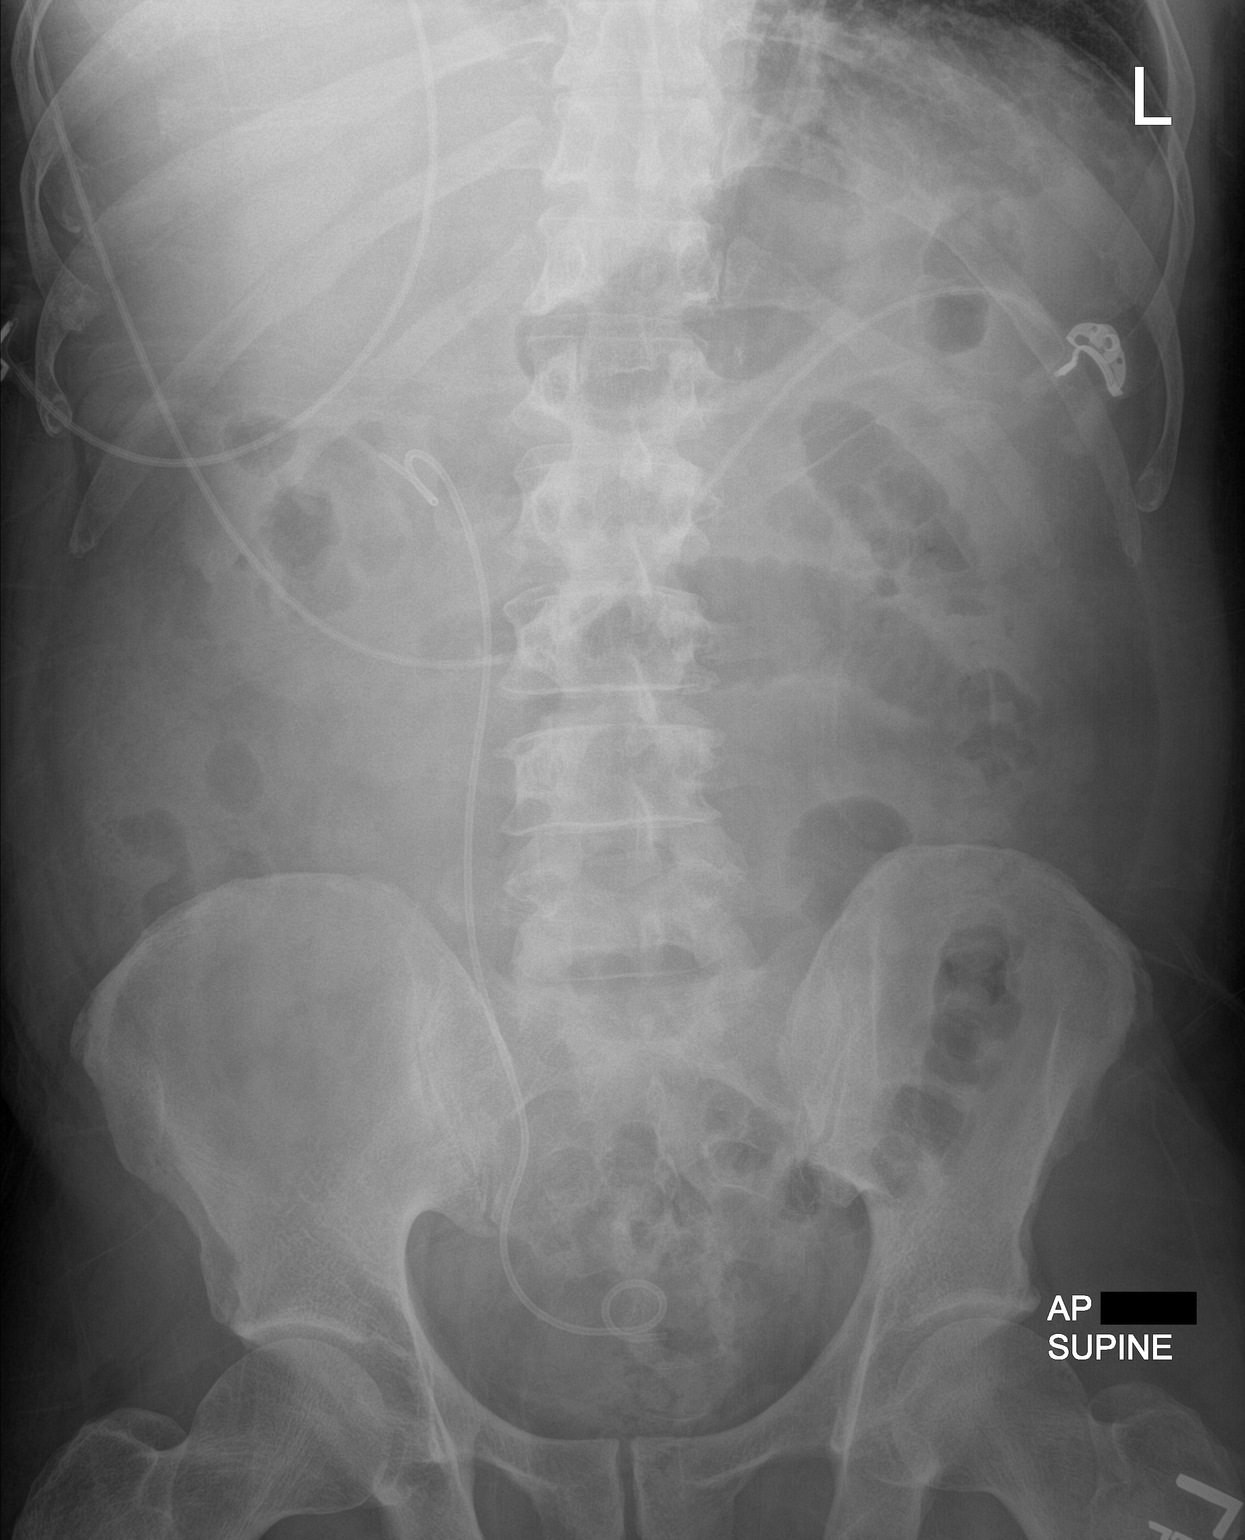

[1 of 1 positions shown; findings below may reference images not displayed]

FINDINGS: Mild gaseous prominence of upper small bowel without definitive
obstruction. Interim placement of right-sided ureteral stent. No
definitive radiopaque calculi.
IMPRESSION: Placement of a right-sided ureteral stent. Nonobstructed bowel-gas
pattern
